# Patient Record
Sex: Female | Born: 1990 | Race: Black or African American | Hispanic: No | Marital: Single | Smoking: Former smoker
Health system: Southern US, Community
[De-identification: ages and names within clinical notes are randomized; demographics above are authoritative.]

## PROBLEM LIST (undated history)

## (undated) DIAGNOSIS — A549 Gonococcal infection, unspecified: Secondary | ICD-10-CM

## (undated) DIAGNOSIS — T7421XA Adult sexual abuse, confirmed, initial encounter: Secondary | ICD-10-CM

## (undated) DIAGNOSIS — J45909 Unspecified asthma, uncomplicated: Secondary | ICD-10-CM

## (undated) HISTORY — DX: Gonococcal infection, unspecified: A54.9

## (undated) HISTORY — DX: Adult sexual abuse, confirmed, initial encounter: T74.21XA

## (undated) HISTORY — PX: NO PAST SURGERIES: SHX2092

---
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
## 2014-08-16 ENCOUNTER — Emergency Department (HOSPITAL_COMMUNITY)
Admission: EM | Admit: 2014-08-16 | Discharge: 2014-08-16 | Disposition: A | Discharge status: Home / Self Care | Payer: BLUE CROSS/BLUE SHIELD | Attending: Emergency Medicine | Admitting: Emergency Medicine

## 2014-08-16 ENCOUNTER — Encounter (HOSPITAL_COMMUNITY): Payer: Self-pay

## 2014-08-16 DIAGNOSIS — Z3202 Encounter for pregnancy test, result negative: Secondary | ICD-10-CM | POA: Insufficient documentation

## 2014-08-16 DIAGNOSIS — Z79899 Other long term (current) drug therapy: Secondary | ICD-10-CM | POA: Diagnosis not present

## 2014-08-16 DIAGNOSIS — K2970 Gastritis, unspecified, without bleeding: Secondary | ICD-10-CM | POA: Diagnosis not present

## 2014-08-16 DIAGNOSIS — J45909 Unspecified asthma, uncomplicated: Secondary | ICD-10-CM | POA: Diagnosis not present

## 2014-08-16 DIAGNOSIS — R1084 Generalized abdominal pain: Secondary | ICD-10-CM | POA: Diagnosis present

## 2014-08-16 DIAGNOSIS — Z87891 Personal history of nicotine dependence: Secondary | ICD-10-CM | POA: Diagnosis not present

## 2014-08-16 LAB — I-STAT CHEM 8, ED
BUN: 13 mg/dL (ref 6–20)
CALCIUM ION: 1.19 mmol/L (ref 1.12–1.23)
CREATININE: 0.8 mg/dL (ref 0.44–1.00)
Chloride: 106 mmol/L (ref 101–111)
GLUCOSE: 155 mg/dL — AB (ref 65–99)
HEMATOCRIT: 42 % (ref 36.0–46.0)
Hemoglobin: 14.3 g/dL (ref 12.0–15.0)
Potassium: 3.4 mmol/L — ABNORMAL LOW (ref 3.5–5.1)
SODIUM: 144 mmol/L (ref 135–145)
TCO2: 23 mmol/L (ref 0–100)

## 2014-08-16 LAB — I-STAT BETA HCG BLOOD, ED (MC, WL, AP ONLY)

## 2014-08-16 MED ORDER — SUCRALFATE 1 GM/10ML PO SUSP: 1.0000 g | Freq: Three times a day (TID) | ORAL | Status: DC

## 2014-08-16 MED ORDER — METOCLOPRAMIDE HCL 5 MG/ML IJ SOLN
10.0000 mg | INTRAMUSCULAR | Status: AC
  Administered 2014-08-16: 10 mg via INTRAVENOUS
  Filled 2014-08-16: qty 2

## 2014-08-16 MED ORDER — PANTOPRAZOLE SODIUM 40 MG IV SOLR
40.0000 mg | INTRAVENOUS | Status: AC
  Administered 2014-08-16: 40 mg via INTRAVENOUS
  Filled 2014-08-16: qty 40

## 2014-08-16 MED ORDER — GI COCKTAIL ~~LOC~~
30.0000 mL | Freq: Once | ORAL | Status: AC
  Administered 2014-08-16: 30 mL via ORAL
  Filled 2014-08-16: qty 30

## 2014-08-16 MED ORDER — PANTOPRAZOLE SODIUM 20 MG PO TBEC: 20.0000 mg | DELAYED_RELEASE_TABLET | Freq: Every day | ORAL | Status: DC

## 2014-08-16 MED ORDER — SODIUM CHLORIDE 0.9 % IV BOLUS (SEPSIS)
1000.0000 mL | Freq: Once | INTRAVENOUS | Status: AC
Start: 2014-08-16 — End: 2014-08-16
  Administered 2014-08-16: 1000 mL via INTRAVENOUS

## 2014-08-16 NOTE — ED Notes (Signed)
PA at bedside.

## 2014-08-16 NOTE — ED Notes (Signed)
Pt c/o n/v and abdominal pain after ingesting ETOH and not eating yesterday.  Pain score 8/10.  Pt is concerned that she has ETOH poisoning.

## 2014-08-16 NOTE — ED Notes (Signed)
Gave pt ginger ale.  

## 2014-08-16 NOTE — ED Provider Notes (Signed)
CSN: [id number]     Arrival date & time 08/16/14  5621 History   First MD Initiated Contact with Patient 08/16/14 [phone]     Chief Complaint  Patient presents with  . Emesis  . Abdominal Pain    (Consider location/radiation/quality/duration/timing/severity/associated sxs/prior Treatment) HPI Comments: Patient is a 24 year old female with a history of asthma. Patient presents to the emergency department for further evaluation of nausea and vomiting with abdominal pain which began yesterday evening. Patient reports that she had nothing to eat yesterday. Yesterday evening she drank multiple alcoholic beverages including a few drinks of liquor. She reports developing some nausea later in the evening. She has had vomiting this morning which was liquidy in appearance and nonbloody. Patient is having a sharp, mild and intermittent pain in her upper abdomen with diffuse abdominal soreness. She has not taken any medication for her symptoms. She reports trying to drink fluids, but this made her feel nauseous and vomit shortly after. She denies any fever, shortness of breath, hematemesis, melanoma or hematochezia, diarrhea, urinary symptoms, or a history of abdominal surgeries. Her last bowel movement was today which was normal, per patient.  Patient is a 24 y.o. female presenting with vomiting and abdominal pain. The history is provided by the patient. No language interpreter was used.  Emesis Associated symptoms: abdominal pain   Abdominal Pain Associated symptoms: nausea and vomiting   Associated symptoms: no dysuria, no fever and no shortness of breath     Past Medical History  Diagnosis Date  . Asthma    History reviewed. No pertinent past surgical history. History reviewed. No pertinent family history. History  Substance Use Topics  . Smoking status: Former Games developer  . Smokeless tobacco: Never Used  . Alcohol Use: Yes     Comment: occasional   OB History    No data available      Review  of Systems  Constitutional: Negative for fever.  Respiratory: Negative for shortness of breath.   Gastrointestinal: Positive for nausea, vomiting and abdominal pain. Negative for blood in stool.  Genitourinary: Negative for dysuria.  All other systems reviewed and are negative.   Allergies  Review of patient's allergies indicates no known allergies.  Home Medications   Prior to Admission medications   Medication Sig Start Date End Date Taking? Authorizing Provider  ibuprofen (ADVIL,MOTRIN) 600 MG tablet Take 1 tablet (600 mg total) by mouth every 6 (six) hours as needed for pain. Patient not taking: Reported on 08/16/2014 12/16/11   Sunnie Nielsen, MD  ibuprofen (ADVIL,MOTRIN) 800 MG tablet Take 1 tablet (800 mg total) by mouth 3 (three) times daily. Patient not taking: Reported on 08/16/2014 12/15/11   Donnetta Hutching, MD  montelukast (SINGULAIR) 10 MG tablet Take 10 mg by mouth at bedtime.    Historical Provider, MD  pantoprazole (PROTONIX) 20 MG tablet Take 1 tablet (20 mg total) by mouth daily. 08/16/14   Antony Madura, PA-C  sucralfate (CARAFATE) 1 GM/10ML suspension Take 10 mLs (1 g total) by mouth 4 (four) times daily -  with meals and at bedtime. 08/16/14   Antony Madura, PA-C   BP 108/59 mmHg  Pulse 68  Temp(Src) 98.1 F (36.7 C) (Oral)  Resp 16  Ht 5\' 2"  (1.575 m)  Wt 110 lb (49.896 kg)  BMI 20.11 kg/m2  SpO2 100%  LMP 08/14/2014   Physical Exam  Constitutional: She is oriented to person, place, and time. She appears well-developed and well-nourished. No distress.  Nontoxic/nonseptic appearing. Patient in no  visible or audible discomfort.  HENT:  Head: Normocephalic and atraumatic.  Eyes: Conjunctivae and EOM are normal. No scleral icterus.  Neck: Normal range of motion.  Cardiovascular: Normal rate, regular rhythm and intact distal pulses.   Pulmonary/Chest: Effort normal and breath sounds normal. No respiratory distress. She has no wheezes. She has no rales.  Respirations even  and unlabored  Abdominal: Soft. She exhibits no distension. There is tenderness. There is no rebound and no guarding.  Mild focal tenderness to palpation in the epigastric abdomen. Negative Murphy sign. No masses or peritoneal signs. Abdomen soft.  Musculoskeletal: Normal range of motion.  Neurological: She is alert and oriented to person, place, and time. She exhibits normal muscle tone. Coordination normal.  GCS 15. Patient moving all extremities. She is ambulatory in the ED with steady gait.  Skin: Skin is warm and dry. No rash noted. She is not diaphoretic. No erythema. No pallor.  Psychiatric: She has a normal mood and affect. Her behavior is normal.  Nursing note and vitals reviewed.   ED Course  Procedures (including critical care time) Labs Review Labs Reviewed  I-STAT CHEM 8, ED - Abnormal; Notable for the following:    Potassium 3.4 (*)    Glucose, Bld 155 (*)    All other components within normal limits  I-STAT BETA HCG BLOOD, ED ([facility], [facility], [facility] ONLY)    Imaging Review No results found.   EKG Interpretation None      MDM   Final diagnoses:  Gastritis    24 year old female presents to the emergency department for evaluation of abdominal pain, nausea, and vomiting x 1 day. She reports no oral intake yesterday, other than many alcoholic beverages. Patient with some mild epigastric tenderness on exam. She has an improved abdominal reexamination after treatment with IV fluids, protonix, GI cocktail, and Reglan. No masses or peritoneal signs. Laboratory work up is noncontributory. Patient is now tolerating oral fluids without c/o pain or further emesis. Symptoms suspected to be secondary to gastritis, likely alcohol induced. Will d/c on course of Protonix and Carafate. Return precautions discussed and provided. Patient discharged in good condition with no unaddressed concerns.   Filed Vitals:   08/16/14 0828 08/16/14 1013  BP: 105/63 108/59  Pulse: 70 68  Temp: 98.1 F  (36.7 C)   TempSrc: Oral   Resp: 18 16  Height:  (1.575 m)   Weight: 110 lb (49.896 kg)   SpO2: 100% 100%     Antony Madura, PA-C 08/16/14 1128  Azalia Bilis, MD 08/16/14 [phone]

## 2014-08-16 NOTE — Discharge Instructions (Signed)

## 2014-10-23 ENCOUNTER — Telehealth (HOSPITAL_BASED_OUTPATIENT_CLINIC_OR_DEPARTMENT_OTHER): Payer: Self-pay | Admitting: Emergency Medicine

## 2015-11-02 ENCOUNTER — Other Ambulatory Visit: Payer: Self-pay | Admitting: Physician Assistant

## 2015-11-02 DIAGNOSIS — R1032 Left lower quadrant pain: Secondary | ICD-10-CM

## 2015-11-02 DIAGNOSIS — R102 Pelvic and perineal pain: Secondary | ICD-10-CM

## 2015-11-11 ENCOUNTER — Ambulatory Visit
Admission: RE | Admit: 2015-11-11 | Discharge: 2015-11-11 | Disposition: A | Discharge status: Home / Self Care | Payer: BLUE CROSS/BLUE SHIELD | Source: Ambulatory Visit | Attending: Physician Assistant | Admitting: Physician Assistant

## 2015-11-11 DIAGNOSIS — R1032 Left lower quadrant pain: Secondary | ICD-10-CM

## 2015-11-11 DIAGNOSIS — R102 Pelvic and perineal pain: Secondary | ICD-10-CM

## 2015-11-16 ENCOUNTER — Other Ambulatory Visit: Payer: BLUE CROSS/BLUE SHIELD

## 2016-02-16 ENCOUNTER — Encounter (HOSPITAL_COMMUNITY): Payer: Self-pay

## 2016-02-16 DIAGNOSIS — J45909 Unspecified asthma, uncomplicated: Secondary | ICD-10-CM | POA: Insufficient documentation

## 2016-02-16 DIAGNOSIS — Z87891 Personal history of nicotine dependence: Secondary | ICD-10-CM | POA: Insufficient documentation

## 2016-02-16 DIAGNOSIS — K5900 Constipation, unspecified: Secondary | ICD-10-CM | POA: Insufficient documentation

## 2016-02-16 DIAGNOSIS — R1084 Generalized abdominal pain: Secondary | ICD-10-CM | POA: Diagnosis present

## 2016-02-16 LAB — I-STAT CHEM 8, ED
BUN: 14 mg/dL (ref 6–20)
CHLORIDE: 104 mmol/L (ref 101–111)
CREATININE: 0.8 mg/dL (ref 0.44–1.00)
Calcium, Ion: 1.21 mmol/L (ref 1.15–1.40)
GLUCOSE: 86 mg/dL (ref 65–99)
HEMATOCRIT: 39 % (ref 36.0–46.0)
HEMOGLOBIN: 13.3 g/dL (ref 12.0–15.0)
POTASSIUM: 4.3 mmol/L (ref 3.5–5.1)
Sodium: 142 mmol/L (ref 135–145)
TCO2: 29 mmol/L (ref 0–100)

## 2016-02-16 NOTE — ED Triage Notes (Signed)
Pt complaining of RLQ abdominal pain. Pt states that she feels like something is stuck there. She states that she has been having this pain since 2013. She also states "I'm not suicidal, but sometimes I think that I just need to stab my stomach and remove it." Pt also states that she is vegan and is chronically constipated. Endorses nausea today when she was crying, but states that the vomit feels stuck. A&O. Ambulatory,

## 2016-02-17 ENCOUNTER — Emergency Department (HOSPITAL_COMMUNITY)
Admission: EM | Admit: 2016-02-17 | Discharge: 2016-02-17 | Disposition: A | Discharge status: Home / Self Care | Payer: BLUE CROSS/BLUE SHIELD | Attending: Emergency Medicine | Admitting: Emergency Medicine

## 2016-02-17 ENCOUNTER — Encounter (HOSPITAL_COMMUNITY): Payer: Self-pay | Admitting: Emergency Medicine

## 2016-02-17 DIAGNOSIS — K5900 Constipation, unspecified: Secondary | ICD-10-CM

## 2016-02-17 LAB — URINALYSIS, ROUTINE W REFLEX MICROSCOPIC
Bilirubin Urine: NEGATIVE
GLUCOSE, UA: NEGATIVE mg/dL
HGB URINE DIPSTICK: NEGATIVE
Ketones, ur: NEGATIVE mg/dL
Leukocytes, UA: NEGATIVE
Nitrite: NEGATIVE
Protein, ur: NEGATIVE mg/dL
SPECIFIC GRAVITY, URINE: 1.026 (ref 1.005–1.030)
pH: 5 (ref 5.0–8.0)

## 2016-02-17 LAB — I-STAT BETA HCG BLOOD, ED (MC, WL, AP ONLY): I-stat hCG, quantitative: <5 m[IU]/mL (ref <5)

## 2016-02-17 LAB — CBC
HCT: 37.6 % (ref 36.0–46.0)
HEMOGLOBIN: 12.9 g/dL (ref 12.0–15.0)
MCH: 29.6 pg (ref 26.0–34.0)
MCHC: 34.3 g/dL (ref 30.0–36.0)
MCV: 86.2 fL (ref 78.0–100.0)
Platelets: 213 10*3/uL (ref 150–400)
RBC: 4.36 MIL/uL (ref 3.87–5.11)
RDW: 12.1 % (ref 11.5–15.5)
WBC: 4.2 10*3/uL (ref 4.0–10.5)

## 2016-02-17 MED ORDER — ACETAMINOPHEN 500 MG PO TABS
1000.0000 mg | ORAL_TABLET | Freq: Once | ORAL | Status: AC
  Administered 2016-02-17: 1000 mg via ORAL
  Filled 2016-02-17: qty 2

## 2016-02-17 NOTE — ED Provider Notes (Signed)
[facility]-EMERGENCY DEPT Provider Note   CSN: [id number] Arrival date & time: 02/16/16  2334  By signing my name below, I, Bing Neighbors., attest that this documentation has been prepared under the direction and in the presence of Taquilla Downum, MD. Electronically signed: Bing Neighbors., ED Scribe. 02/17/16. 3:16 AM.    History   Chief Complaint Chief Complaint  Patient presents with  . Abdominal Pain    HPI  Jordan Haley is a 26 y.o. female with hx of gonorrhea, and chronic constipation who presents to the Emergency Department complaining of mild to moderate RLQ abdominal pain with onset x5 years. Pt states that she has had RLQ abdominal pain since 2013. Pt describes the pain as a pressure in the RLQ. Pt reports nausea, hx of chronic constipation. Pt denies any modifying factors but states that apple cider vinegar and pressure alleviates the pain. Pt denies vomiting. Pt is a vegan and states that she eats soy but suspects that she may be allergic to it.     The history is provided by the patient. No language interpreter was used.  Abdominal Pain   This is a chronic problem. The current episode started more than 1 week ago (5 years ago). The problem occurs constantly. The problem has not changed since onset.The pain is associated with an unknown factor. The pain is located in the generalized abdominal region. The pain is moderate. Associated symptoms include constipation. Pertinent negatives include anorexia, fever, belching, diarrhea, flatus, hematochezia, melena, nausea, vomiting, dysuria, frequency, hematuria, headaches, arthralgias and myalgias. Nothing aggravates the symptoms. Nothing relieves the symptoms. Past workup does not include GI consult. Her past medical history does not include PUD.    Past Medical History:  Diagnosis Date  . Asthma     There are no active problems to display for this patient.   History reviewed. No pertinent surgical  history.  OB History    No data available       Home Medications    Prior to Admission medications   Medication Sig Start Date End Date Taking? Authorizing Provider  ibuprofen (ADVIL,MOTRIN) 600 MG tablet Take 1 tablet (600 mg total) by mouth every 6 (six) hours as needed for pain. Patient not taking: Reported on 08/16/2014 12/16/11   Sunnie Nielsen, MD  ibuprofen (ADVIL,MOTRIN) 800 MG tablet Take 1 tablet (800 mg total) by mouth 3 (three) times daily. Patient not taking: Reported on 08/16/2014 12/15/11   Donnetta Hutching, MD  pantoprazole (PROTONIX) 20 MG tablet Take 1 tablet (20 mg total) by mouth daily. Patient not taking: Reported on 02/17/2016 08/16/14   Antony Madura, PA-C  sucralfate (CARAFATE) 1 GM/10ML suspension Take 10 mLs (1 g total) by mouth 4 (four) times daily -  with meals and at bedtime. Patient not taking: Reported on 02/17/2016 08/16/14   Antony Madura, PA-C    Family History History reviewed. No pertinent family history.  Social History Social History  Substance Use Topics  . Smoking status: Former Games developer  . Smokeless tobacco: Never Used  . Alcohol use Yes     Comment: occasional     Allergies   Patient has no known allergies.   Review of Systems Review of Systems  Constitutional: Negative for chills and fever.  Gastrointestinal: Positive for abdominal pain and constipation. Negative for anorexia, diarrhea, flatus, hematochezia, melena, nausea, rectal pain and vomiting.  Genitourinary: Negative for dysuria, frequency, hematuria and vaginal discharge.  Musculoskeletal: Negative for arthralgias and myalgias.  Neurological: Negative for  headaches.  All other systems reviewed and are negative.    Physical Exam Updated Vital Signs BP 105/70 (BP Location: Left Arm)   Pulse 79   Temp 97.7 F (36.5 C) (Oral)   Resp 18   SpO2 95%   Physical Exam  Constitutional: She appears well-developed and well-nourished. No distress.  HENT:  Head: Normocephalic and  atraumatic.  Mouth/Throat: Mucous membranes are normal. No oropharyngeal exudate.  Moist mucous membranes.  Eyes: Conjunctivae are normal.  Neck: Normal range of motion. Neck supple.  Cardiovascular: Normal rate, regular rhythm and intact distal pulses.   No murmur heard. Pulmonary/Chest: Effort normal and breath sounds normal. No respiratory distress. She has no wheezes. She has no rales.  Abdominal: Soft. She exhibits no mass. Bowel sounds are increased. There is no hepatosplenomegaly, splenomegaly or hepatomegaly. There is no tenderness. There is no rebound and no guarding. No hernia.  Diffuse constipation through the ascending and descending colon, no masses. Hyperactive bowel sounds.   Musculoskeletal: Normal range of motion. She exhibits no edema or tenderness.  Neurological: She is alert.  Skin: Skin is warm and dry. Capillary refill takes less than 2 seconds.  Psychiatric: She has a normal mood and affect.  Nursing note and vitals reviewed.    ED Treatments / Results   Vitals:   02/16/16 2342 02/17/16 0324  BP: 105/70 (!) 86/53  Pulse: 79 69  Resp: 18 16  Temp: 97.7 F (36.5 C)    Results for orders placed or performed during the hospital encounter of 02/17/16  CBC  Result Value Ref Range   WBC 4.2 4.0 - 10.5 K/uL   RBC 4.36 3.87 - 5.11 MIL/uL   Hemoglobin 12.9 12.0 - 15.0 g/dL   HCT 16.1 09.6 - 04.5 %   MCV 86.2 78.0 - 100.0 fL   MCH 29.6 26.0 - 34.0 pg   MCHC 34.3 30.0 - 36.0 g/dL   RDW 40.9 81.1 - 91.4 %   Platelets 213 150 - 400 K/uL  Urinalysis, Routine w reflex microscopic  Result Value Ref Range   Color, Urine YELLOW YELLOW   APPearance CLEAR CLEAR   Specific Gravity, Urine 1.026 1.005 - 1.030   pH 5.0 5.0 - 8.0   Glucose, UA NEGATIVE NEGATIVE mg/dL   Hgb urine dipstick NEGATIVE NEGATIVE   Bilirubin Urine NEGATIVE NEGATIVE   Ketones, ur NEGATIVE NEGATIVE mg/dL   Protein, ur NEGATIVE NEGATIVE mg/dL   Nitrite NEGATIVE NEGATIVE   Leukocytes, UA NEGATIVE  NEGATIVE  I-Stat beta hCG blood, ED  Result Value Ref Range   I-stat hCG, quantitative <5.0 <5 mIU/mL   Comment 3          I-Stat Chem 8, ED  Result Value Ref Range   Sodium 142 135 - 145 mmol/L   Potassium 4.3 3.5 - 5.1 mmol/L   Chloride 104 101 - 111 mmol/L   BUN 14 6 - 20 mg/dL   Creatinine, Ser 7.82 0.44 - 1.00 mg/dL   Glucose, Bld 86 65 - 99 mg/dL   Calcium, Ion 9.56 2.13 - 1.40 mmol/L   TCO2 29 0 - 100 mmol/L   Hemoglobin 13.3 12.0 - 15.0 g/dL   HCT 08.6 57.8 - 46.9 %   No results found.   COORDINATION OF CARE: 3:17 AM-Discussed next steps with pt. Pt verbalized understanding and is agreeable with the plan.   Radiology No results found.  Procedures Procedures (including critical care time)  Medications Ordered in ED Medications  acetaminophen (TYLENOL) tablet  1,000 mg (1,000 mg Oral Given 02/17/16 0430)     Final Clinical Impressions(s) / ED Diagnoses  Exam and history are consistent with chronic constipation.  All questions answered to patient's satisfaction. Based on history and exam patient has been appropriately medically screened and emergency conditions excluded. Patient is stable for discharge at this time. Strict return precautions given for any further episodes, persistent fever, weakness or any concerns. Prune juice for constipation as patient only wants to try "all natural things"   New Prescriptions New Prescriptions   No medications on file   I personally performed the services described in this documentation, which was scribed in my presence. The recorded information has been reviewed and is accurate.       Cy BlamerApril Sherrey North, MD 02/17/16 [phone]

## 2016-02-17 NOTE — Discharge Instructions (Signed)
Prune juice twice a day

## 2016-04-17 ENCOUNTER — Emergency Department (HOSPITAL_COMMUNITY)
Admission: EM | Admit: 2016-04-17 | Discharge: 2016-04-18 | Disposition: A | Discharge status: Other Acute Inpatient Hospital | Payer: BLUE CROSS/BLUE SHIELD | Attending: Emergency Medicine | Admitting: Emergency Medicine

## 2016-04-17 DIAGNOSIS — Z79899 Other long term (current) drug therapy: Secondary | ICD-10-CM | POA: Insufficient documentation

## 2016-04-17 DIAGNOSIS — R440 Auditory hallucinations: Secondary | ICD-10-CM | POA: Diagnosis present

## 2016-04-17 DIAGNOSIS — F333 Major depressive disorder, recurrent, severe with psychotic symptoms: Secondary | ICD-10-CM | POA: Insufficient documentation

## 2016-04-17 DIAGNOSIS — R1033 Periumbilical pain: Secondary | ICD-10-CM | POA: Diagnosis not present

## 2016-04-17 DIAGNOSIS — Z87891 Personal history of nicotine dependence: Secondary | ICD-10-CM | POA: Diagnosis not present

## 2016-04-17 DIAGNOSIS — J45909 Unspecified asthma, uncomplicated: Secondary | ICD-10-CM | POA: Diagnosis not present

## 2016-04-17 LAB — CBC
HCT: 40.3 % (ref 36.0–46.0)
Hemoglobin: 13.5 g/dL (ref 12.0–15.0)
MCH: 29.2 pg (ref 26.0–34.0)
MCHC: 33.5 g/dL (ref 30.0–36.0)
MCV: 87 fL (ref 78.0–100.0)
Platelets: 258 10*3/uL (ref 150–400)
RBC: 4.63 MIL/uL (ref 3.87–5.11)
RDW: 12.2 % (ref 11.5–15.5)
WBC: 3.2 10*3/uL — ABNORMAL LOW (ref 4.0–10.5)

## 2016-04-17 LAB — COMPREHENSIVE METABOLIC PANEL
ALK PHOS: 55 U/L (ref 38–126)
ALT: 12 U/L — ABNORMAL LOW (ref 14–54)
ANION GAP: 3 — AB (ref 5–15)
AST: 17 U/L (ref 15–41)
Albumin: 4.7 g/dL (ref 3.5–5.0)
BILIRUBIN TOTAL: 0.9 mg/dL (ref 0.3–1.2)
BUN: 6 mg/dL (ref 6–20)
CALCIUM: 9.1 mg/dL (ref 8.9–10.3)
CO2: 27 mmol/L (ref 22–32)
Chloride: 110 mmol/L (ref 101–111)
Creatinine, Ser: 0.7 mg/dL (ref 0.44–1.00)
GFR calc non Af Amer: >60 mL/min (ref >60)
Glucose, Bld: 95 mg/dL (ref 65–99)
Potassium: 3.7 mmol/L (ref 3.5–5.1)
Sodium: 140 mmol/L (ref 135–145)
TOTAL PROTEIN: 7.2 g/dL (ref 6.5–8.1)

## 2016-04-17 LAB — RAPID URINE DRUG SCREEN, HOSP PERFORMED
Amphetamines: NOT DETECTED
BARBITURATES: NOT DETECTED
Benzodiazepines: NOT DETECTED
COCAINE: NOT DETECTED
Opiates: NOT DETECTED
Tetrahydrocannabinol: NOT DETECTED

## 2016-04-17 LAB — ETHANOL

## 2016-04-17 LAB — SALICYLATE LEVEL

## 2016-04-17 LAB — ACETAMINOPHEN LEVEL
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
## 2016-04-17 NOTE — ED Notes (Signed)
Pt presents with SI, denies at present, stating she was that way earlier.  Feeling hopeless and depressed.  Admits to hearing voices.  Denies drug or alcohol use.  Monitoring for safety, Q 15 min checks in effect.  Safety check for contraband completed, no items found.
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
## 2016-04-17 NOTE — ED Notes (Signed)
Report called to RN Erskine SquibbJane, [facility], rm [id number].  Pending [facility] transport.

## 2016-04-18 ENCOUNTER — Encounter (HOSPITAL_COMMUNITY): Payer: Self-pay | Admitting: *Deleted

## 2016-04-18 ENCOUNTER — Inpatient Hospital Stay (HOSPITAL_COMMUNITY)
Admission: AD | Admit: 2016-04-18 | Discharge: 2016-04-23 | DRG: 897 | Disposition: A | Discharge status: Home / Self Care | Payer: BLUE CROSS/BLUE SHIELD | Source: Intra-hospital | Attending: Psychiatry | Admitting: Psychiatry

## 2016-04-18 DIAGNOSIS — K219 Gastro-esophageal reflux disease without esophagitis: Secondary | ICD-10-CM | POA: Diagnosis present

## 2016-04-18 DIAGNOSIS — F1220 Cannabis dependence, uncomplicated: Secondary | ICD-10-CM | POA: Clinically undetermined

## 2016-04-18 DIAGNOSIS — R45851 Suicidal ideations: Secondary | ICD-10-CM | POA: Diagnosis present

## 2016-04-18 DIAGNOSIS — F19950 Other psychoactive substance use, unspecified with psychoactive substance-induced psychotic disorder with delusions: Secondary | ICD-10-CM | POA: Diagnosis present

## 2016-04-18 DIAGNOSIS — G8929 Other chronic pain: Secondary | ICD-10-CM | POA: Diagnosis present

## 2016-04-18 DIAGNOSIS — Z79899 Other long term (current) drug therapy: Secondary | ICD-10-CM | POA: Diagnosis not present

## 2016-04-18 DIAGNOSIS — F4323 Adjustment disorder with mixed anxiety and depressed mood: Secondary | ICD-10-CM | POA: Clinically undetermined

## 2016-04-18 DIAGNOSIS — J45909 Unspecified asthma, uncomplicated: Secondary | ICD-10-CM | POA: Diagnosis present

## 2016-04-18 DIAGNOSIS — F250 Schizoaffective disorder, bipolar type: Secondary | ICD-10-CM | POA: Diagnosis not present

## 2016-04-18 DIAGNOSIS — F3181 Bipolar II disorder: Secondary | ICD-10-CM | POA: Clinically undetermined

## 2016-04-18 DIAGNOSIS — Z7289 Other problems related to lifestyle: Secondary | ICD-10-CM | POA: Diagnosis not present

## 2016-04-18 DIAGNOSIS — F323 Major depressive disorder, single episode, severe with psychotic features: Secondary | ICD-10-CM | POA: Diagnosis present

## 2016-04-18 DIAGNOSIS — F411 Generalized anxiety disorder: Secondary | ICD-10-CM | POA: Diagnosis present

## 2016-04-18 DIAGNOSIS — R109 Unspecified abdominal pain: Secondary | ICD-10-CM | POA: Diagnosis present

## 2016-04-18 DIAGNOSIS — Z87891 Personal history of nicotine dependence: Secondary | ICD-10-CM

## 2016-04-18 DIAGNOSIS — F1290 Cannabis use, unspecified, uncomplicated: Secondary | ICD-10-CM | POA: Diagnosis not present

## 2016-04-18 LAB — TSH: TSH: 1.331 u[IU]/mL (ref 0.350–4.500)

## 2016-04-18 LAB — LIPID PANEL
CHOL/HDL RATIO: 2.3 ratio
Cholesterol: 104 mg/dL (ref 0–200)
HDL: 45 mg/dL (ref >40)
LDL Cholesterol: 48 mg/dL (ref 0–99)
Triglycerides: 56 mg/dL (ref <150)
VLDL: 11 mg/dL (ref 0–40)

## 2016-04-18 LAB — PREGNANCY, URINE: PREG TEST UR: NEGATIVE

## 2016-04-18 MED ORDER — ACETAMINOPHEN 325 MG PO TABS: 650.0000 mg | ORAL_TABLET | Freq: Four times a day (QID) | ORAL | Status: DC | PRN

## 2016-04-18 MED ORDER — HALOPERIDOL 5 MG PO TABS: 5.0000 mg | ORAL_TABLET | Freq: Four times a day (QID) | ORAL | Status: DC | PRN

## 2016-04-18 MED ORDER — ALUM & MAG HYDROXIDE-SIMETH 200-200-20 MG/5ML PO SUSP: 30.0000 mL | ORAL | Status: DC | PRN

## 2016-04-18 MED ORDER — OLANZAPINE 2.5 MG PO TABS
2.5000 mg | ORAL_TABLET | Freq: Every day | ORAL | Status: DC
  Filled 2016-04-18 (×3): qty 1

## 2016-04-18 MED ORDER — RISPERIDONE 2 MG PO TBDP: 2.0000 mg | ORAL_TABLET | Freq: Three times a day (TID) | ORAL | Status: DC | PRN

## 2016-04-18 MED ORDER — PANTOPRAZOLE SODIUM 20 MG PO TBEC
20.0000 mg | DELAYED_RELEASE_TABLET | Freq: Every day | ORAL | Status: DC
  Administered 2016-04-18 – 2016-04-23 (×4): 20 mg via ORAL
  Filled 2016-04-18 (×9): qty 1

## 2016-04-18 MED ORDER — SUCRALFATE 1 GM/10ML PO SUSP
1.0000 g | Freq: Three times a day (TID) | ORAL | Status: DC
  Administered 2016-04-18 – 2016-04-23 (×9): 1 g via ORAL
  Filled 2016-04-18 (×31): qty 10

## 2016-04-18 MED ORDER — HALOPERIDOL LACTATE 5 MG/ML IJ SOLN: 5.0000 mg | Freq: Four times a day (QID) | INTRAMUSCULAR | Status: DC | PRN

## 2016-04-18 MED ORDER — ARIPIPRAZOLE 10 MG PO TABS
10.0000 mg | ORAL_TABLET | Freq: Every day | ORAL | Status: DC
  Administered 2016-04-19 – 2016-04-20 (×2): 10 mg via ORAL
  Filled 2016-04-18 (×4): qty 1

## 2016-04-18 MED ORDER — FLUOXETINE HCL 20 MG PO CAPS
20.0000 mg | ORAL_CAPSULE | Freq: Every day | ORAL | Status: DC
  Administered 2016-04-18: 20 mg via ORAL
  Filled 2016-04-18 (×4): qty 1

## 2016-04-18 MED ORDER — TRAZODONE HCL 50 MG PO TABS
50.0000 mg | ORAL_TABLET | Freq: Every evening | ORAL | Status: DC | PRN
  Filled 2016-04-18 (×14): qty 1

## 2016-04-18 MED ORDER — IBUPROFEN 600 MG PO TABS: 600.0000 mg | ORAL_TABLET | Freq: Four times a day (QID) | ORAL | Status: DC | PRN

## 2016-04-18 MED ORDER — ZIPRASIDONE MESYLATE 20 MG IM SOLR: 20.0000 mg | INTRAMUSCULAR | Status: DC | PRN

## 2016-04-18 MED ORDER — LORAZEPAM 1 MG PO TABS: 1.0000 mg | ORAL_TABLET | ORAL | Status: DC | PRN

## 2016-04-18 MED ORDER — HYDROXYZINE HCL 25 MG PO TABS: 25.0000 mg | ORAL_TABLET | Freq: Three times a day (TID) | ORAL | Status: DC | PRN

## 2016-04-18 MED ORDER — MAGNESIUM HYDROXIDE 400 MG/5ML PO SUSP: 30.0000 mL | Freq: Every day | ORAL | Status: DC | PRN

## 2016-04-18 NOTE — Tx Team (Signed)
Interdisciplinary Treatment and Diagnostic Plan Update  04/19/2016 Time of Session: 8:36 AM  Jordan Haley MRN: [id number]  Principal Diagnosis: Schizoaffective disorder, bipolar type ([place])  Secondary Diagnoses: Principal Problem:   Schizoaffective disorder, bipolar type ([place]) Active Problems:   Severe major depression with psychotic features ([place])   Current Medications:  Current Facility-Administered Medications  Medication Dose Route Frequency Provider Last Rate Last Dose  . acetaminophen (TYLENOL) tablet 650 mg  650 mg Oral Q6H PRN Laverle Hobby, PA-C      . alum & mag hydroxide-simeth (MAALOX/MYLANTA) 200-200-20 MG/5ML suspension 30 mL  30 mL Oral Q4H PRN Laverle Hobby, PA-C      . ARIPiprazole (ABILIFY) tablet 10 mg  10 mg Oral Daily Artist Beach, MD   10 mg at 04/19/16 0819  . haloperidol (HALDOL) tablet 5 mg  5 mg Oral Q6H PRN Artist Beach, MD       Or  . haloperidol lactate (HALDOL) injection 5 mg  5 mg Intramuscular Q6H PRN Artist Beach, MD      . hydrOXYzine (ATARAX/VISTARIL) tablet 25 mg  25 mg Oral TID PRN Laverle Hobby, PA-C      . ibuprofen (ADVIL,MOTRIN) tablet 600 mg  600 mg Oral Q6H PRN Laverle Hobby, PA-C      . LORazepam (ATIVAN) tablet 1 mg  1 mg Oral PRN Laverle Hobby, PA-C      . magnesium hydroxide (MILK OF MAGNESIA) suspension 30 mL  30 mL Oral Daily PRN Laverle Hobby, PA-C      . pantoprazole (PROTONIX) EC tablet 20 mg  20 mg Oral Daily Laverle Hobby, PA-C   20 mg at 04/19/16 0819  . sucralfate (CARAFATE) 1 GM/10ML suspension 1 g  1 g Oral TID WC & HS Laverle Hobby, PA-C   1 g at 04/19/16 0819  . traZODone (DESYREL) tablet 50 mg  50 mg Oral QHS,MR X 1 Spencer E Simon, PA-C        PTA Medications: Prescriptions Prior to Admission  Medication Sig Dispense Refill Last Dose  . ibuprofen (ADVIL,MOTRIN) 600 MG tablet Take 1 tablet (600 mg total) by mouth every 6 (six) hours as needed for pain. (Patient not taking: Reported on  08/16/2014) 30 tablet 0 Unknown at Unknown time  . ibuprofen (ADVIL,MOTRIN) 800 MG tablet Take 1 tablet (800 mg total) by mouth 3 (three) times daily. (Patient not taking: Reported on 08/16/2014) 21 tablet 0 Unknown at Unknown time  . pantoprazole (PROTONIX) 20 MG tablet Take 1 tablet (20 mg total) by mouth daily. (Patient not taking: Reported on 02/17/2016) 30 tablet 0 Unknown at Unknown time  . sucralfate (CARAFATE) 1 GM/10ML suspension Take 10 mLs (1 g total) by mouth 4 (four) times daily -  with meals and at bedtime. (Patient not taking: Reported on 02/17/2016) 420 mL 0 Unknown at Unknown time    Treatment Modalities: Medication Management, Group therapy, Case management,  1 to 1 session with clinician, Psychoeducation, Recreational therapy.   Physician Treatment Plan for Primary Diagnosis: Schizoaffective disorder, bipolar type ([place]) Long Term Goal(s): Improvement in symptoms so as ready for discharge  Short Term Goals: Ability to identify changes in lifestyle to reduce recurrence of condition will improve and Ability to disclose and discuss suicidal ideas  Medication Management: Evaluate patient's response, side effects, and tolerance of medication regimen.  Therapeutic Interventions: 1 to 1 sessions, Unit Group sessions and Medication administration.  Evaluation of Outcomes: Not Met  Physician  Treatment Plan for Secondary Diagnosis: Principal Problem:   Schizoaffective disorder, bipolar type ([place]) Active Problems:   Severe major depression with psychotic features ([place])   Long Term Goal(s): Improvement in symptoms so as ready for discharge  Short Term Goals: Compliance with prescribed medications will improve  Medication Management: Evaluate patient's response, side effects, and tolerance of medication regimen.  Therapeutic Interventions: 1 to 1 sessions, Unit Group sessions and Medication administration.  Evaluation of Outcomes: Not Met   RN Treatment Plan for Primary  Diagnosis: Schizoaffective disorder, bipolar type ([place]) Long Term Goal(s): Knowledge of disease and therapeutic regimen to maintain health will improve  Short Term Goals: Ability to disclose and discuss suicidal ideas and Compliance with prescribed medications will improve  Medication Management: RN will administer medications as ordered by provider, will assess and evaluate patient's response and provide education to patient for prescribed medication. RN will report any adverse and/or side effects to prescribing provider.  Therapeutic Interventions: 1 on 1 counseling sessions, Psychoeducation, Medication administration, Evaluate responses to treatment, Monitor vital signs and CBGs as ordered, Perform/monitor CIWA, COWS, AIMS and Fall Risk screenings as ordered, Perform wound care treatments as ordered.  Evaluation of Outcomes: Not Met   LCSW Treatment Plan for Primary Diagnosis: Schizoaffective disorder, bipolar type ([place]) Long Term Goal(s): Safe transition to appropriate next level of care at discharge, Engage patient in therapeutic group addressing interpersonal concerns.  Short Term Goals: Engage patient in aftercare planning with referrals and resources and Increase skills for wellness and recovery  Therapeutic Interventions: Assess for all discharge needs, 1 to 1 time with Social worker, Explore available resources and support systems, Assess for adequacy in community support network, Educate family and significant other(s) on suicide prevention, Complete Psychosocial Assessment, Interpersonal group therapy.  Evaluation of Outcomes: Not Met   Progress in Treatment: Attending groups: Yes Participating in groups: Yes Taking medication as prescribed: Yes, MD continues to assess for medication changes as needed Toleration medication: Yes, no side effects reported at this time Family/Significant other contact made: No, there are no supports who are locally available at this time.   Patient understands diagnosis: Limited insight  Discussing patient identified problems/goals with staff: Yes Medical problems stabilized or resolved: Yes Denies suicidal/homicidal ideation: No  Issues/concerns per patient self-inventory: None Other: N/A  New problem(s) identified: None identified at this time.   New Short Term/Long Term Goal(s): None identified at this time.   Discharge Plan or Barriers: Return home and follow up with [facility]   Reason for Continuation of Hospitalization: Anxiety Depression Hallucinations Medication stabilization Suicidal ideation   Estimated Length of Stay: 3-5 days  Attendees: Patient: 04/19/2016  8:36 AM  Physician: Dr. Shea Evans 04/19/2016  8:36 AM  Nursing:  Opal Sidles. Jenetta Downer RN  04/19/2016  8:36 AM  RN Care Manager: Lars Pinks 04/19/2016  8:36 AM  Social Worker: Ripley Fraise, LCSW 04/19/2016  8:36 AM  Recreational Therapist: Winfield Cunas 04/19/2016  8:36 AM  Other: Radonna Ricker, Social Work Intern  04/19/2016  8:36 AM  Other:  04/19/2016  8:36 AM  Other: 04/19/2016  8:36 AM    Scribe for Treatment Team: Radonna Ricker, Social Work Intern 04/19/2016 8:36 AM

## 2016-04-18 NOTE — Progress Notes (Signed)
Recreation Therapy Notes  INPATIENT RECREATION THERAPY ASSESSMENT  Patient Details Name: Ferd GlassingMariama Dolph MRN: [id number] DOB: 21-Mar-1990 Today's Date: 04/18/2016  Patient Stressors: Other (Comment) (Interacting with people; looking for own reassurance)  Pt stated she was here because she felt hopeless, lonely and suicidal.  Coping Skills:   Isolate, Avoidance, Exercise, Art/Dance, Music  Personal Challenges: Anger, Communication, Concentration, Decision-Making, Expressing Yourself, Problem-Solving, Relationships, School Performance, Self-Esteem/Confidence, Social Interaction, Stress Management, Time Management, Trusting Others  Leisure Interests (2+):  Individual - Reading, Social - Friends, Music - Listen, Sports - Dance, Individual - Other (Comment) (Clean up; think of new ideas; meditate)  Awareness of Community Resources:  No  Patient Strengths:  Connecting with people; being myself  Patient Identified Areas of Improvement:  Being honest with myself; being proud of self  Current Recreation Participation:  Not often/ weekends  Patient Goal for Hospitalization:  "To completely feel good about what's going on in my life, be happy"  [place]idence[place]  [place]idence[place]  Current [place] (including self-harm):  No  Current HI:  No  Consent to Intern Participation: N/A   Caroll RancherMarjette Shanequa Whitenight, LRT/CTRS  Caroll RancherLindsay, Brena Windsor A 04/18/2016, 2:35 PM

## 2016-04-18 NOTE — Progress Notes (Signed)
[facility] Group Notes:  (Nursing/MHT/Case Management/Adjunct)  Date:  04/18/2016  Time:  9:16 PM  Type of Therapy:  Psychoeducational Skills  Participation Level:  Active  Participation Quality:  Appropriate  Affect:  Appropriate  Cognitive:  Appropriate  Insight:  Good  Engagement in Group:  Developing/Improving  Modes of Intervention:  Education  Summary of Progress/Problems: The patient arrived late for group but answered appropriately. She explained that she spent quite a bit of time reading and reflecting on herself and her own issues. As for her goal for tomorrow, she intends to talk more.   Hazle CocaGOODMAN, Zakiah Beckerman S 04/18/2016, 9:16 PM

## 2016-04-18 NOTE — [facility] Suicide Risk Assessment (Signed)
[facility] INPATIENT:  Family/Significant Other Suicide Prevention Education  Suicide Prevention Education:  Patient Refusal for Family/Significant Other Suicide Prevention Education: The patient Jordan Haley has refused to provide written consent for family/significant other to be provided Family/Significant Other Suicide Prevention Education during admission and/or prior to discharge.  Physician notified.   Patient did give permission to contact her father Jordan Haley, however he is currently in [place] and not available for local support.   Jordan DaubJolan Terrilyn Haley 04/18/2016, 12:47 PM

## 2016-04-18 NOTE — Progress Notes (Signed)
Recreation Therapy Notes  Date: 04/18/16 Time: 1000 Location: 500 Hall Dayroom  Group Topic: Coping Skills  Goal Area(s) Addresses:  Patients will be able to identify the benefits of coping skills. Patients will be able to positive coping skills. Patients will be able to identify benefits of using coping skills post d/c.  Intervention:  Mindmap, pencils  Activity:  Mindmap.  Patients were given a blank mindmap to fill out.  LRT and patients filled in the first eight boxes together with situations that garnered the use of coping skills.  Patients identified stress, weight gain, anxiety, depression, death in the family, hurting someone you love, losing you job and arguments.  Patients were to then come up with coping skills for each of the areas identified.  Education: PharmacologistCoping Skills, Building control surveyorDischarge Planning.   Education Outcome: Acknowledges understanding/In group clarification offered/Needs additional education.   Clinical Observations/Feedback:  Pt did not attend group.   Caroll RancherMarjette Spence Soberano, LRT/CTRS         Caroll RancherLindsay, Cybele Maule A 04/18/2016 12:49 PM
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
## 2016-04-18 NOTE — [facility] Group Notes (Signed)
[facility] Group Notes:  (Counselor/Nursing/MHT/Case Management/Adjunct)  04/18/2016 1:15PM  Type of Therapy:  Group Therapy  Participation Level:  Active  Participation Quality:  Appropriate  Affect:  Flat  Cognitive:  Oriented  Insight:  Improving  Engagement in Group:  Limited  Engagement in Therapy:  Limited  Modes of Intervention:  Discussion, Exploration and Socialization  Summary of Progress/Problems: The topic for group was balance in life.  Pt participated in the discussion about when their life was in balance and out of balance and how this feels.  Pt discussed ways to get back in balance and short term goals they can work on to get where they want to be. Invited.  Chose to not attend.  Daryel Geraldorth, Tijuan Dantes B 04/18/2016 1:40 PM

## 2016-04-18 NOTE — Progress Notes (Signed)
DAR NOTE: Pt present with flat affect and depressed mood in the unit. Pt has been isolating, stayed in the room most of time writing and reading. Pt denies physical pain, took all her meds as scheduled. As per self inventory, pt had a good night sleep, good appetite, normal energy, and good concentration. Pt rate depression at 4, hopeless ness at 2, and anxiety at 2. Pt's safety ensured with 15 minute and environmental checks. Pt currently denies SI/HI and A/V hallucinations. Pt verbally agrees to seek staff if SI/HI or A/VH occurs and to consult with staff before acting on these thoughts. Will continue POC.
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
## 2016-04-18 NOTE — Progress Notes (Signed)
EKG done, a copy placed in the chart, NP informed about the result.
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
## 2016-04-18 NOTE — H&P (Signed)
Psychiatric Admission Assessment Adult  Patient Identification: Jordan Haley MRN:  [id number] Date of Evaluation:  04/18/2016 Chief Complaint:  MDD WITH PSYCHOTIC FEATURES Principal Diagnosis: Severe major depression with psychotic features (HCC) Diagnosis:   Patient Active Problem List   Diagnosis Date Noted  . Severe major depression with psychotic features [facility]) [F32.3] 04/18/2016    Priority: High   History of Present Illness: Jordan Haley is an 26 y.o. female. She presents to [facility], voluntarily. She was [facility] to [facility] by [facility]. Patient was also accompanied by a Field seismologist. She has suicidal thoughts with a plan to stab self in the chest.   She was seen today and she was pleasant however tangential and spoke with pressured speech.  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  She states that she needs to "focus on myself holistically, I drink many teas, I go yoga, it helps me forgive myself because I have a lot of guilt.  I used to do yoga, but something is wrong with my stomach.   She reports that she was with a friend and then later a friend who was [place] who wanted to be more than just friends.  Then patient would start talking about taking holistic studies.  She sasks this NP for a list of schools that could help her study to be a provider for holistic healing.  ngoing suicidal thoughts worsening after a breakup with a boyfriend.   In the chart and the patient did not mention this"  Sts that she recently found out her boyfriend was dating a new girl. She saw pictures and videor of the exboyfriend with his new girlfriend. Sts that this made her increasingly upset and now she has distorted thoughts.  Sts that she acquired a STD and a stuck tampon yrs ago and has since suffered from chronic pain issues in her stomach. She is in constant agonizing stomach pain from those two incidents. Patient sts that she feels helpless and isolates  herself from others. She denies HI. She is currently calm and cooperative. No legal issues. She attends school at [employer] and works at [employer]. She also reports auditory hallucinations of voices telling her demeaning things about herself. The voices tell her, "You are ugly and worthless". Patient has pressured speech and flight of ideas. She has no current outpatient therapist or psychiatrist. NO history of INPT mental health history. No history of alcohol or drug use. UDS and BAL are both negative.   Associated Signs/Symptoms: Depression Symptoms:  depressed mood, fatigue, hopelessness, impaired memory, (Hypo) Manic Symptoms:  Distractibility, Flight of Ideas, Labiality of Mood, Anxiety Symptoms:  Excessive Worry, Psychotic Symptoms:  Hallucinations: Auditory PTSD Symptoms: NA Total Time spent with patient: 45 minutes  Past Psychiatric History: see HPI  Is the patient at risk to self? Yes.    Has the patient been a risk to self in the past 6 months? Yes.    Has the patient been a risk to self within the distant past? Yes.    Is the patient a risk to others? No.  Has the patient been a risk to others in the past 6 months? No.  Has the patient been a risk to others within the distant past? No.   Prior Inpatient Therapy:   Prior Outpatient Therapy:    Alcohol Screening: 1. How often do you have a drink containing alcohol?: Monthly or less 2. How many drinks containing alcohol do  you have on a typical day when you are drinking?: 1 or 2 3. How often do you have six or more drinks on one occasion?: Never Preliminary Score: 0 9. Have you or someone else been injured as a result of your drinking?: No 10. Has a relative or friend or a doctor or another health worker been concerned about your drinking or suggested you cut down?: No Alcohol Use Disorder Identification Test Final Score (AUDIT): 1 Brief Intervention: AUDIT score less than 7 or less-screening does not suggest unhealthy  drinking-brief intervention not indicated Substance Abuse History in the last 12 months:  No. Consequences of Substance Abuse: NA Previous Psychotropic Medications: No  Psychological Evaluations: Yes  Past Medical History:  Past Medical History:  Diagnosis Date  . Asthma     Past Surgical History:  Procedure Laterality Date  . NO PAST SURGERIES     Family History: History reviewed. No pertinent family history. Family Psychiatric  History: Denies mental history in family Tobacco Screening: Have you used any form of tobacco in the last 30 days? (Cigarettes, Smokeless Tobacco, Cigars, and/or Pipes): No Social History:  History  Alcohol Use  . Yes    Comment: occasional; social     History  Drug Use  . Types: Marijuana    Additional Social History: Marital status: Single Are you sexually active?: Yes What is your sexual orientation?: Heterosexual  Has your sexual activity been affected by drugs, alcohol, medication, or emotional stress?: No  Does patient have children?: No    Pain Medications: See home med list Prescriptions: See home med list Over the Counter: See home med list History of alcohol / drug use?: Yes Negative Consequences of Use: Legal Name of Substance 1: marijuana 1 - Age of First Use: teens 1 - Amount (size/oz): varies 1 - Frequency: occasionally 1 - Duration: ongoing 1 - Last Use / Amount: unknown                  Allergies:  No Known Allergies Lab Results:  Results for orders placed or performed during the hospital encounter of 04/18/16 (from the past 48 hour(s))  Pregnancy, urine     Status: None   Collection Time: 04/18/16  6:00 AM  Result Value Ref Range   Preg Test, Ur NEGATIVE NEGATIVE    Comment:        THE SENSITIVITY OF THIS METHODOLOGY IS >20 mIU/mL. Performed at [facility], [address], [place], [place] [address]   TSH     Status: None   Collection Time: 04/18/16  6:14 AM  Result Value Ref Range    TSH 1.331 0.350 - 4.500 uIU/mL    Comment: Performed by a 3rd Generation assay with a functional sensitivity of <=0.01 uIU/mL. Performed at [facility], [address], [place], [place] [address]   Lipid panel     Status: None   Collection Time: 04/18/16  6:14 AM  Result Value Ref Range   Cholesterol 104 0 - 200 mg/dL   Triglycerides 56 <098 mg/dL   HDL 45 >11 mg/dL   Total CHOL/HDL Ratio 2.3 RATIO   VLDL 11 0 - 40 mg/dL   LDL Cholesterol 48 0 - 99 mg/dL    Comment:        Total Cholesterol/HDL:CHD Risk Coronary Heart Disease Risk Table                     Men   Women  1/2 Average Risk   3.4   3.3  Average Risk       5.0   4.4  2 X Average Risk   9.6   7.1  3 X Average Risk  23.4   11.0        Use the calculated Patient Ratio above and the CHD Risk Table to determine the patient's CHD Risk.        ATP III CLASSIFICATION (LDL):  <100     mg/dL   Optimal  161-096  mg/dL   Near or Above                    Optimal  130-159  mg/dL   Borderline  045-409  mg/dL   High  >811     mg/dL   Very High Performed at [facility], [address] [place], [place] [address]     Blood Alcohol level:  Lab Results  Component Value Date   ETH <5 04/17/2016    Metabolic Disorder Labs:  No results found for: HGBA1C, MPG No results found for: PROLACTIN Lab Results  Component Value Date   CHOL 104 04/18/2016   TRIG 56 04/18/2016   HDL 45 04/18/2016   CHOLHDL 2.3 04/18/2016   VLDL 11 04/18/2016   LDLCALC 48 04/18/2016    Current Medications: Current Facility-Administered Medications  Medication Dose Route Frequency Provider Last Rate Last Dose  . acetaminophen (TYLENOL) tablet 650 mg  650 mg Oral Q6H PRN Kerry Hough, PA-C      . alum & mag hydroxide-simeth (MAALOX/MYLANTA) 200-200-20 MG/5ML suspension 30 mL  30 mL Oral Q4H PRN Kerry Hough, PA-C      . FLUoxetine (PROZAC) capsule 20 mg  20 mg Oral Daily Kerry Hough, PA-C   20 mg at 04/18/16 0830   . hydrOXYzine (ATARAX/VISTARIL) tablet 25 mg  25 mg Oral TID PRN Kerry Hough, PA-C      . ibuprofen (ADVIL,MOTRIN) tablet 600 mg  600 mg Oral Q6H PRN Kerry Hough, PA-C      . risperiDONE (RISPERDAL M-TABS) disintegrating tablet 2 mg  2 mg Oral Q8H PRN Kerry Hough, PA-C       And  . LORazepam (ATIVAN) tablet 1 mg  1 mg Oral PRN Kerry Hough, PA-C       And  . ziprasidone (GEODON) injection 20 mg  20 mg Intramuscular PRN Kerry Hough, PA-C      . magnesium hydroxide (MILK OF MAGNESIA) suspension 30 mL  30 mL Oral Daily PRN Kerry Hough, PA-C      . OLANZapine (ZYPREXA) tablet 2.5 mg  2.5 mg Oral QHS Spencer E Simon, PA-C      . pantoprazole (PROTONIX) EC tablet 20 mg  20 mg Oral Daily Kerry Hough, PA-C   20 mg at 04/18/16 0830  . sucralfate (CARAFATE) 1 GM/10ML suspension 1 g  1 g Oral TID WC & HS Kerry Hough, PA-C   1 g at 04/18/16 1202  . traZODone (DESYREL) tablet 50 mg  50 mg Oral QHS,MR X 1 Spencer E Simon, PA-C       PTA Medications: Prescriptions Prior to Admission  Medication Sig Dispense Refill Last Dose  . ibuprofen (ADVIL,MOTRIN) 600 MG tablet Take 1 tablet (600 mg total) by mouth every 6 (six) hours as needed for pain. (Patient not taking: Reported on 08/16/2014) 30 tablet 0 Unknown at Unknown time  . ibuprofen (  ADVIL,MOTRIN) 800 MG tablet Take 1 tablet (800 mg total) by mouth 3 (three) times daily. (Patient not taking: Reported on 08/16/2014) 21 tablet 0 Unknown at Unknown time  . pantoprazole (PROTONIX) 20 MG tablet Take 1 tablet (20 mg total) by mouth daily. (Patient not taking: Reported on 02/17/2016) 30 tablet 0 Unknown at Unknown time  . sucralfate (CARAFATE) 1 GM/10ML suspension Take 10 mLs (1 g total) by mouth 4 (four) times daily -  with meals and at bedtime. (Patient not taking: Reported on 02/17/2016) 420 mL 0 Unknown at Unknown time    Musculoskeletal: Strength & Muscle Tone: within normal limits Gait & Station: normal Patient leans:  N/A  Psychiatric Specialty Exam: Physical Exam  Nursing note and vitals reviewed.   ROS  Blood pressure 113/77, pulse (!) 55, temperature 98.7 F (37.1 C), temperature source Oral, resp. rate 18, height 5\' 2"  (1.575 m), weight 48.5 kg (107 lb), last menstrual period 04/12/2016, SpO2 100 %.Body mass index is 19.57 kg/m.  General Appearance: Casual  Eye Contact:  Good  Speech:  Normal Rate  Volume:  Normal  Mood:  Anxious  Affect:  Appropriate  Thought Process:  Disorganized and Irrelevant  Orientation:  Full (Time, Place, and Person)  Thought Content:  Illogical, Rumination and Tangential  Suicidal Thoughts:  No  Homicidal Thoughts:  No  Memory:  Immediate;   Fair Recent;   Fair Remote;   Fair  Judgement:  Fair  Insight:  Fair  Psychomotor Activity:  Normal  Concentration:  Concentration: Poor and Attention Span: Poor  Recall:  [employer] of Knowledge:  Fair  Language:  Good  Akathisia:  No  Handed:  Right  AIMS (if indicated):     Assets:  Desire for Improvement Resilience Social Support  ADL's:  Intact  Cognition:  WNL  Sleep:  Number of Hours: 3 (Late admission)    Treatment Plan Summary: Admit for crisis management and mood stabilization. Medication management to re-stabilize current mood symptoms Group counseling sessions for coping skills Medical consults as needed Review and reinstate any pertinent home medications for other health problems   Observation Level/Precautions:  15 minute checks  Laboratory:  per ED  Psychotherapy:    Medications:    Consultations:    Discharge Concerns:    Estimated LOS:  Other:     Physician Treatment Plan for Primary Diagnosis: Severe major depression with psychotic features (HCC) Long Term Goal(s): Improvement in symptoms so as ready for discharge  Short Term Goals: Ability to identify changes in lifestyle to reduce recurrence of condition will improve, Ability to verbalize feelings will improve, Ability to disclose  and discuss suicidal ideas, Ability to demonstrate self-control will improve, Ability to identify and develop effective coping behaviors will improve, Ability to maintain clinical measurements within normal limits will improve, Compliance with prescribed medications will improve and Ability to identify triggers associated with substance abuse/mental health issues will improve  Physician Treatment Plan for Secondary Diagnosis: Principal Problem:   Severe major depression with psychotic features (HCC)  Long Term Goal(s): Improvement in symptoms so as ready for discharge  Short Term Goals: Ability to identify changes in lifestyle to reduce recurrence of condition will improve, Ability to verbalize feelings will improve, Ability to disclose and discuss suicidal ideas, Ability to demonstrate self-control will improve, Ability to identify and develop effective coping behaviors will improve, Ability to maintain clinical measurements within normal limits will improve, Compliance with prescribed medications will improve and Ability to identify triggers associated  with substance abuse/mental health issues will improve  I certify that inpatient services furnished can reasonably be expected to improve the patient's condition.    [facility] May Monita Swier, [place] 3/28/20183:47 PM

## 2016-04-19 LAB — HEMOGLOBIN A1C
HEMOGLOBIN A1C: 4.9 % (ref 4.8–5.6)
MEAN PLASMA GLUCOSE: 94 mg/dL

## 2016-04-19 LAB — PROLACTIN: Prolactin: 36.4 ng/mL — ABNORMAL HIGH (ref 4.8–23.3)

## 2016-04-19 NOTE — Progress Notes (Signed)
[facility] MD Progress Note  04/19/2016 4:09 PM Jordan Haley  MRN:  [id number] Subjective:  Patient states that she is tired today. Objective:  Jordan Lusackis an 26 y.o.female, [employer] student who developed   suicidal thoughts with a plan to stab self in the chest, stressed out by recent breakup with BF.  Patient was tangential, manic speech yesterday, she is more calm today however, somnolent during assessment.  She is compliant with meds and per nursing has no disruptive behavior.    Principal Problem: Schizoaffective disorder, bipolar type (HCC) Diagnosis:   Patient Active Problem List   Diagnosis Date Noted  . Severe major depression with psychotic features (HCC) [F32.3] 04/18/2016    Priority: High  . Schizoaffective disorder, bipolar type (HCC) [F25.0] 04/18/2016   Total Time spent with patient: 30 minutes  Past Psychiatric History: see HPI  Past Medical History:  Past Medical History:  Diagnosis Date  . Asthma     Past Surgical History:  Procedure Laterality Date  . NO PAST SURGERIES     Family History: History reviewed. No pertinent family history. Family Psychiatric  History: see HPI Social History:  History  Alcohol Use  . Yes    Comment: occasional; social     History  Drug Use  . Types: Marijuana    Social History   Social History  . Marital status: Single    Spouse name: N/A  . Number of children: N/A  . Years of education: N/A   Social History Main Topics  . Smoking status: Former Games developer  . Smokeless tobacco: Never Used  . Alcohol use Yes     Comment: occasional; social  . Drug use: Yes    Types: Marijuana  . Sexual activity: Yes    Birth control/ protection: None   Other Topics Concern  . None   Social History Narrative  . None   Additional Social History:    Pain Medications: See home med list Prescriptions: See home med list Over the Counter: See home med list History of alcohol / drug use?: Yes Negative Consequences of Use:  Legal Name of Substance 1: marijuana 1 - Age of First Use: teens 1 - Amount (size/oz): varies 1 - Frequency: occasionally 1 - Duration: ongoing 1 - Last Use / Amount: unknown                  Sleep: Fair  Appetite:  Fair  Current Medications: Current Facility-Administered Medications  Medication Dose Route Frequency Provider Last Rate Last Dose  . acetaminophen (TYLENOL) tablet 650 mg  650 mg Oral Q6H PRN Kerry Hough, PA-C      . alum & mag hydroxide-simeth (MAALOX/MYLANTA) 200-200-20 MG/5ML suspension 30 mL  30 mL Oral Q4H PRN Kerry Hough, PA-C      . ARIPiprazole (ABILIFY) tablet 10 mg  10 mg Oral Daily Georgiann Cocker, MD   10 mg at 04/19/16 0819  . haloperidol (HALDOL) tablet 5 mg  5 mg Oral Q6H PRN Georgiann Cocker, MD       Or  . haloperidol lactate (HALDOL) injection 5 mg  5 mg Intramuscular Q6H PRN Georgiann Cocker, MD      . hydrOXYzine (ATARAX/VISTARIL) tablet 25 mg  25 mg Oral TID PRN Kerry Hough, PA-C      . ibuprofen (ADVIL,MOTRIN) tablet 600 mg  600 mg Oral Q6H PRN Kerry Hough, PA-C      . LORazepam (ATIVAN) tablet 1 mg  1 mg Oral  PRN Kerry Hough, PA-C      . magnesium hydroxide (MILK OF MAGNESIA) suspension 30 mL  30 mL Oral Daily PRN Kerry Hough, PA-C      . pantoprazole (PROTONIX) EC tablet 20 mg  20 mg Oral Daily Kerry Hough, PA-C   20 mg at 04/19/16 0819  . sucralfate (CARAFATE) 1 GM/10ML suspension 1 g  1 g Oral TID WC & HS Kerry Hough, PA-C   1 g at 04/19/16 0819  . traZODone (DESYREL) tablet 50 mg  50 mg Oral QHS,MR X 1 Kerry Hough, PA-C        Lab Results:  Results for orders placed or performed during the hospital encounter of 04/18/16 (from the past 48 hour(s))  Pregnancy, urine     Status: None   Collection Time: 04/18/16  6:00 AM  Result Value Ref Range   Preg Test, Ur NEGATIVE NEGATIVE    Comment:        THE SENSITIVITY OF THIS METHODOLOGY IS >20 mIU/mL. Performed at [facility],  [address], [place], [place] [address]   TSH     Status: None   Collection Time: 04/18/16  6:14 AM  Result Value Ref Range   TSH 1.331 0.350 - 4.500 uIU/mL    Comment: Performed by a 3rd Generation assay with a functional sensitivity of <=0.01 uIU/mL. Performed at [facility], [address], [place], [place] [address]   Prolactin     Status: Abnormal   Collection Time: 04/18/16  6:14 AM  Result Value Ref Range   Prolactin 36.4 (H) 4.8 - 23.3 ng/mL    Comment: (NOTE) Performed At: [facility] [address] [place], [place] [id number] Mila Homer MD [id number] Performed at [facility], [address], [place], [place] [address]   Lipid panel     Status: None   Collection Time: 04/18/16  6:14 AM  Result Value Ref Range   Cholesterol 104 0 - 200 mg/dL   Triglycerides 56 <606 mg/dL   HDL 45 >30 mg/dL   Total CHOL/HDL Ratio 2.3 RATIO   VLDL 11 0 - 40 mg/dL   LDL Cholesterol 48 0 - 99 mg/dL    Comment:        Total Cholesterol/HDL:CHD Risk Coronary Heart Disease Risk Table                     Men   Women  1/2 Average Risk   3.4   3.3  Average Risk       5.0   4.4  2 X Average Risk   9.6   7.1  3 X Average Risk  23.4   11.0        Use the calculated Patient Ratio above and the CHD Risk Table to determine the patient's CHD Risk.        ATP III CLASSIFICATION (LDL):  <100     mg/dL   Optimal  160-109  mg/dL   Near or Above                    Optimal  130-159  mg/dL   Borderline  323-557  mg/dL   High  >322     mg/dL   Very High Performed at [facility], [address], [place], [place] [address]   Hemoglobin A1c     Status: None   Collection Time: 04/18/16  6:14  AM  Result Value Ref Range   Hgb A1c MFr Bld 4.9 4.8 - 5.6 %    Comment: (NOTE)         Pre-diabetes: 5.7 - 6.4         Diabetes: >6.4         Glycemic control for adults with diabetes: <7.0    Mean Plasma Glucose 94 mg/dL    Comment:  (NOTE) Performed At: [facility] [address] [place], [place] [id number] Mila HomerHancock William F MD [id number]:[phone] Performed at [facility], [address] [place], [place] [address]     Blood Alcohol level:  Lab Results  Component Value Date   ETH <5 04/17/2016    Metabolic Disorder Labs: Lab Results  Component Value Date   HGBA1C 4.9 04/18/2016   MPG 94 04/18/2016   Lab Results  Component Value Date   PROLACTIN 36.4 (H) 04/18/2016   Lab Results  Component Value Date   CHOL 104 04/18/2016   TRIG 56 04/18/2016   HDL 45 04/18/2016   CHOLHDL 2.3 04/18/2016   VLDL 11 04/18/2016   LDLCALC 48 04/18/2016    Physical Findings: AIMS: Facial and Oral Movements Muscles of Facial Expression: None, normal Lips and Perioral Area: None, normal Jaw: None, normal Tongue: None, normal,Extremity Movements Upper (arms, wrists, hands, fingers): None, normal Lower (legs, knees, ankles, toes): None, normal, Trunk Movements Neck, shoulders, hips: None, normal, Overall Severity Severity of abnormal movements (highest score from questions above): None, normal Incapacitation due to abnormal movements: None, normal Patient's awareness of abnormal movements (rate only patient's report): No Awareness, Dental Status Current problems with teeth and/or dentures?: No Does patient usually wear dentures?: No  CIWA:    COWS:     Musculoskeletal: Strength & Muscle Tone: within normal limits Gait & Station: normal Patient leans: N/A  Psychiatric Specialty Exam: Physical Exam  Nursing note reviewed.   ROS  Blood pressure 113/77, pulse (!) 55, temperature 98.7 F (37.1 C), temperature source Oral, resp. rate 18, height 5\' 2"  (1.575 m), weight 48.5 kg (107 lb), last menstrual period 04/12/2016, SpO2 100 %.Body mass index is 19.57 kg/m.  General Appearance: Casual  Eye Contact:  Good  Speech:  Clear and Coherent  Volume:  Normal  Mood:  Anxious and Depressed   Affect:  Appropriate  Thought Process:  Disorganized  Orientation:  Full (Time, Place, and Person)  Thought Content:  Tangential  Suicidal Thoughts:  No  Homicidal Thoughts:  No  Memory:  Immediate;   Fair Recent;   Fair Remote;   Fair  Judgement:  Fair  Insight:  Fair  Psychomotor Activity:  Normal  Concentration:  Concentration: Fair and Attention Span: Fair  Recall:  FiservFair  Fund of Knowledge:  Fair  Language:  Fair  Akathisia:  No  Handed:  Right  AIMS (if indicated):     Assets:  Desire for Improvement Resilience  ADL's:  Intact  Cognition:  WNL  Sleep:  Number of Hours: 3 (Late admission)   Treatment Plan Summary: Review of chart, vital signs, medications, and notes.  1-Individual and group therapy  2-Medication management for depression and anxiety: Medications reviewed with the patient.  Abilify 10 mg daily mood stabilization 3-Coping skills for depression, anxiety  4-Continue crisis stabilization and management  5-Address health issues--monitoring vital signs, stable  6-Treatment plan in progress to prevent relapse of depression and anxiety 7 - labs reviewed, discussed plan of care with Dr Sandi Mariscalobos  Anusha Claus Patria ManeMay Kaylum Shrum, NP  BC 04/19/2016, 4:09 PM

## 2016-04-19 NOTE — [facility] Group Notes (Signed)
Type of Therapy:  Group Therapy   Participation Level:  Engaged  Participation Quality:  Attentive  Affect:  Appropriate   Cognitive:  Alert   Insight:  Engaged  Engagement in Therapy:  Improving   Mode s of Intervention:  Education, Exploration, Socialization   Summary of Progress/Problems: Engaged throughout, stayed entire time.   Jordan Haley from [facility] [employer] was here to tell her story of recovery and inform patients about [facility] and their services.     

## 2016-04-19 NOTE — Progress Notes (Signed)
Pt has been in her room most of the shift reading and writing in the journal given to her this morning.  She reports that her day has been good.  She has spent a lot of time talking on the phone this evening.  She voices no needs or concerns at this time.  She would prefer not to take anything for sleep tonight.  She says the carafate is really helping her stomach pain.  Her mood is euphoric.  Her thoughts are tangential with rapid, pressured speech.  She asks appropriate questions and wants to learn coping skills to help her deal with disappointments in life.  She denies SI/HI/AVH.  Support and encouragement offered.  Discharge plans are in process.  Safety maintained with q15 minute checks.

## 2016-04-19 NOTE — Progress Notes (Signed)
Pt reports she is feeling anxious tonight because she needs to discharge tomorrow to get back to work so she can pay her bills and get ready for exams.  She says after this semester she is going to take a break from school to hopefully reduce her stress.  She says she was told it might be Sunday before she would discharge.  She says she feels better and more in control of her thoughts.  She denies SI/HI/AVH.  She says the conflict with her friend is resolved.  She has been cooperative and pleasant on the unit with staff and peers.  Support and encouragement offered.  Pt was encouraged to speak with the doctor about her concerns.  Discharge plans are in process.  Safety maintained with q15 minute checks.

## 2016-04-19 NOTE — Progress Notes (Signed)
Recreation Therapy Notes  Date: 04/19/16 Time: 1000 Location: 500 Hall Dayroom  Group Topic: Communication, Team Building, Problem Solving  Goal Area(s) Addresses:  Patient will effectively work with peer towards shared goal.  Patient will identify skills used to make activity successful.  Patient will identify how skills used during activity can be used to reach post d/c goals.   Behavioral Response: Engaged  Intervention: STEM Activity  Activity: Stage managerLanding Pad. In teams patients were given 12 plastic drinking straws and a length of masking tape. Using the materials provided patients were asked to build a landing pad to catch a golf ball dropped from approximately 6 feet in the air.   Education: Pharmacist, communityocial Skills, Discharge Planning   Education Outcome: Acknowledges education/In group clarification offered/Needs additional education.   Clinical Observations/Feedback: Pt was bright and engaged but lacked confidence in herself.  Pt expressed the group could have communicated better in the beginning.  Pt also stated she could have been more clear and spoke up about her ideas.   Caroll RancherMarjette Margurite Duffy, LRT/CTRS     Caroll RancherLindsay, Marisah Laker A 04/19/2016 12:18 PM

## 2016-04-19 NOTE — Progress Notes (Signed)
D: Pt continues to be very flat and depressed on the unit today. Pt also continues to be very isolative Stayed in the room most of the time.Pt reported this morning that she was ready for discharge,pt sated she has exams coming, bills to pay, and also to go back work.. Pt reported that her depression was a 0, his hopelessness was a 0, and that his anxiety was a 1. Pt reported being negative SI/HI, no AH/VH noted. A: 15 min checks continued for patient safety. R: Pts safety maintained.

## 2016-04-20 DIAGNOSIS — Z79899 Other long term (current) drug therapy: Secondary | ICD-10-CM

## 2016-04-20 DIAGNOSIS — F250 Schizoaffective disorder, bipolar type: Secondary | ICD-10-CM

## 2016-04-20 DIAGNOSIS — Z87891 Personal history of nicotine dependence: Secondary | ICD-10-CM

## 2016-04-20 DIAGNOSIS — F1290 Cannabis use, unspecified, uncomplicated: Secondary | ICD-10-CM

## 2016-04-20 NOTE — Plan of Care (Signed)
Problem: Safety: Goal: Periods of time without injury will increase Outcome: Progressing Pt safe on the unit at this time   

## 2016-04-20 NOTE — Progress Notes (Signed)
D: Pt denies SI/HI/AVH. Pt is pleasant and very animated, hyperverbal. Pt stated she had a good day and is focused on her D/C. Pt appears to have no focus on anything, her mind is scattered with various ideas and she goes from one to the other even before she finishes her thoughts.   A: Pt was offered support and encouragement.  Pt was encourage to attend groups. Q 15 minute checks were done for safety.   R:Pt attends groups and interacts well with peers and staff. Pt receptive to treatment and safety maintained on unit.

## 2016-04-20 NOTE — Progress Notes (Signed)
Adult Psychoeducational Group Note  Date:  04/20/2016 Time:  9:52 PM  Group Topic/Focus:  Wrap-Up Group:   The focus of this group is to help patients review their daily goal of treatment and discuss progress on daily workbooks.  Participation Level:  Active  Participation Quality:  Appropriate  Affect:  Appropriate  Cognitive:  Appropriate  Insight: Appropriate  Engagement in Group:  Engaged  Modes of Intervention:  Discussion  Additional Comments:  The patient expressed that she rates today a 10.The patient also said that support group was very positive for her.  Octavio Manns 04/20/2016, 9:52 PM

## 2016-04-20 NOTE — Progress Notes (Signed)
Recreation Therapy Notes  Date: 04/20/16 Time: 1000 Location: 500 Hall Dayroom  Group Topic: Leisure Education  Goal Area(s) Addresses:  Patient will identify positive leisure activities.  Patient will identify one positive benefit of participation in leisure activities.   Behavioral Response: Engaged  Intervention: Leisure activities, can, dry erase marker, dry erase board, eraser  Activity: Leisure Pictionary.  One patient would come up and draw a slip from the can containing a leisure activity on it.  The patient is to then draw the activity on the board.  The remaining patients are to try and guess the picture.  The person that guesses the picture will get the next turn.  Education:  Leisure Education, Building control surveyor  Education Outcome: Acknowledges education/In group clarification offered/Needs additional education  Clinical Observations/Feedback: Pt was bright and active during group.  Pt also interacted well with peers.  Pt stated doing more leisure would allow her the opportunity to "learn different perspectives, spend time with friends and be more relaxed".   Jordan Haley, LRT/CTRS     Jordan Haley A 04/20/2016 12:17 PM

## 2016-04-20 NOTE — Progress Notes (Signed)
  [facility] Adult Case Management Discharge Plan :  Will you be returning to the same living situation after discharge:  Yes,  me At discharge, do you have transportation home?: Yes,  bus Do you have the ability to pay for your medications: Yes,  [employer] [facility]  Release of information consent forms completed and in the chart;  Patient's signature needed at discharge.  Patient to Follow up at: Follow-up Information    [facility]. Go on 04/27/2016.   Why:  Crisis Management Meeting is Friday, April 27, 2016 at 10:00am  Then, Friday, May 04, 2016 at 10:00am with Landis Martins Either walk in or call to the counseling center on Monday and talk to [place] about what you should do until your Friday meeting Contact information: [address] [place] [address] Telephone: [phone] Fax: [phone]          Next level of care provider has access to [facility] Link:no  Safety Planning and Suicide Prevention discussed: Yes,  yes  Have you used any form of tobacco in the last 30 days? (Cigarettes, Smokeless Tobacco, Cigars, and/or Pipes): No  Has patient been referred to the Quitline?: N/A patient is not a smoker  Patient has been referred for addiction treatment: N/A  Ida Rogue 04/20/2016, 11:20 AM

## 2016-04-20 NOTE — [facility] Group Notes (Signed)
[facility] LCSW Group Therapy  04/20/2016  1:05 PM  Type of Therapy:  Group therapy  Participation Level:  Active  Participation Quality:  Attentive  Affect:  Flat  Cognitive:  Oriented  Insight:  Limited  Engagement in Therapy:  Limited  Modes of Intervention:  Discussion, Socialization  Summary of Progress/Problems:  Chaplain was here to lead a group on themes of hope and courage. "Support is a solid person that will be there no matter what, and will not judge. I don't have to apologize for anything, and there is feeedom and safety to be who I am."  Went on to say she does not really have anyone like that.  "But I feel like I have support through God.  Myfaith carries me through."  Used the analogy of her time here as climbing a staircase.  "We are all trying to higher ground, to a better place. We reflect each other here."  Stayed the entire time, engaged throughout.  Daryel Gerald B 04/20/2016 1:03 PM

## 2016-04-20 NOTE — Tx Team (Signed)
Interdisciplinary Treatment and Diagnostic Plan Update  04/20/2016 Time of Session: 11:14 AM  Jordan Haley MRN: [id number]  Principal Diagnosis: Schizoaffective disorder, bipolar type (HCC)  Secondary Diagnoses: Principal Problem:   Schizoaffective disorder, bipolar type (HCC) Active Problems:   Severe major depression with psychotic features (HCC)   Current Medications:  Current Facility-Administered Medications  Medication Dose Route Frequency Provider Last Rate Last Dose  . acetaminophen (TYLENOL) tablet 650 mg  650 mg Oral Q6H PRN Kerry Hough, PA-C      . alum & mag hydroxide-simeth (MAALOX/MYLANTA) 200-200-20 MG/5ML suspension 30 mL  30 mL Oral Q4H PRN Kerry Hough, PA-C      . ARIPiprazole (ABILIFY) tablet 10 mg  10 mg Oral Daily Georgiann Cocker, MD   10 mg at 04/20/16 0855  . haloperidol (HALDOL) tablet 5 mg  5 mg Oral Q6H PRN Georgiann Cocker, MD       Or  . haloperidol lactate (HALDOL) injection 5 mg  5 mg Intramuscular Q6H PRN Georgiann Cocker, MD      . hydrOXYzine (ATARAX/VISTARIL) tablet 25 mg  25 mg Oral TID PRN Kerry Hough, PA-C      . ibuprofen (ADVIL,MOTRIN) tablet 600 mg  600 mg Oral Q6H PRN Kerry Hough, PA-C      . LORazepam (ATIVAN) tablet 1 mg  1 mg Oral PRN Kerry Hough, PA-C      . magnesium hydroxide (MILK OF MAGNESIA) suspension 30 mL  30 mL Oral Daily PRN Kerry Hough, PA-C      . pantoprazole (PROTONIX) EC tablet 20 mg  20 mg Oral Daily Kerry Hough, PA-C   20 mg at 04/20/16 0855  . sucralfate (CARAFATE) 1 GM/10ML suspension 1 g  1 g Oral TID WC & HS Kerry Hough, PA-C   1 g at 04/20/16 0854  . traZODone (DESYREL) tablet 50 mg  50 mg Oral QHS,MR X 1 Spencer E Simon, PA-C        PTA Medications: Prescriptions Prior to Admission  Medication Sig Dispense Refill Last Dose  . ibuprofen (ADVIL,MOTRIN) 600 MG tablet Take 1 tablet (600 mg total) by mouth every 6 (six) hours as needed for pain. (Patient not taking: Reported on  08/16/2014) 30 tablet 0 Unknown at Unknown time  . ibuprofen (ADVIL,MOTRIN) 800 MG tablet Take 1 tablet (800 mg total) by mouth 3 (three) times daily. (Patient not taking: Reported on 08/16/2014) 21 tablet 0 Unknown at Unknown time  . pantoprazole (PROTONIX) 20 MG tablet Take 1 tablet (20 mg total) by mouth daily. (Patient not taking: Reported on 02/17/2016) 30 tablet 0 Unknown at Unknown time  . sucralfate (CARAFATE) 1 GM/10ML suspension Take 10 mLs (1 g total) by mouth 4 (four) times daily -  with meals and at bedtime. (Patient not taking: Reported on 02/17/2016) 420 mL 0 Unknown at Unknown time    Treatment Modalities: Medication Management, Group therapy, Case management,  1 to 1 session with clinician, Psychoeducation, Recreational therapy.   Physician Treatment Plan for Primary Diagnosis: Schizoaffective disorder, bipolar type (HCC) Long Term Goal(s): Improvement in symptoms so as ready for discharge  Short Term Goals: Ability to identify changes in lifestyle to reduce recurrence of condition will improve and Ability to disclose and discuss suicidal ideas  Medication Management: Evaluate patient's response, side effects, and tolerance of medication regimen.  Therapeutic Interventions: 1 to 1 sessions, Unit Group sessions and Medication administration.  Evaluation of Outcomes: Adequate for Discharge  Physician Treatment Plan for Secondary Diagnosis: Principal Problem:   Schizoaffective disorder, bipolar type (HCC) Active Problems:   Severe major depression with psychotic features (HCC)   Long Term Goal(s): Improvement in symptoms so as ready for discharge  Short Term Goals: Compliance with prescribed medications will improve  Medication Management: Evaluate patient's response, side effects, and tolerance of medication regimen.  Therapeutic Interventions: 1 to 1 sessions, Unit Group sessions and Medication administration.  Evaluation of Outcomes: Adequate for Discharge   RN  Treatment Plan for Primary Diagnosis: Schizoaffective disorder, bipolar type (HCC) Long Term Goal(s): Knowledge of disease and therapeutic regimen to maintain health will improve  Short Term Goals: Ability to disclose and discuss suicidal ideas and Compliance with prescribed medications will improve  Medication Management: RN will administer medications as ordered by provider, will assess and evaluate patient's response and provide education to patient for prescribed medication. RN will report any adverse and/or side effects to prescribing provider.  Therapeutic Interventions: 1 on 1 counseling sessions, Psychoeducation, Medication administration, Evaluate responses to treatment, Monitor vital signs and CBGs as ordered, Perform/monitor CIWA, COWS, AIMS and Fall Risk screenings as ordered, Perform wound care treatments as ordered.  Evaluation of Outcomes: Adequate for Discharge   Recreational Therapy Treatment Plan for Primary Diagnosis: Schizoaffective disorder, bipolar type (HCC) Long Term Goal(s): Patient will participate in recreation therapy treatment in at least 2 group sessions without prompting from LRT  Short Term Goals: Patient will be able to identify at least 5 coping skills for admitting diagnosis by conclusion of recreation therapy treatment  Treatment Modalities: Group and Pet Therapy  Therapeutic Interventions: Psychoeducation  Evaluation of Outcomes: Progressing   LCSW Treatment Plan for Primary Diagnosis: Schizoaffective disorder, bipolar type (HCC) Long Term Goal(s): Safe transition to appropriate next level of care at discharge, Engage patient in therapeutic group addressing interpersonal concerns.  Short Term Goals: Engage patient in aftercare planning with referrals and resources and Increase skills for wellness and recovery  Therapeutic Interventions: Assess for all discharge needs, 1 to 1 time with Social worker, Explore available resources and support systems,  Assess for adequacy in community support network, Educate family and significant other(s) on suicide prevention, Complete Psychosocial Assessment, Interpersonal group therapy.  Evaluation of Outcomes: Adequate for Discharge   Progress in Treatment: Attending groups: Yes Participating in groups: Yes Taking medication as prescribed: Yes, MD continues to assess for medication changes as needed Toleration medication: Yes, no side effects reported at this time Family/Significant other contact made: No, there are no supports who are locally available at this time.  Patient understands diagnosis: Limited insight  Discussing patient identified problems/goals with staff: Yes Medical problems stabilized or resolved: Yes Denies suicidal/homicidal ideation: No  Issues/concerns per patient self-inventory: None Other: N/A  New problem(s) identified: None identified at this time.   New Short Term/Long Term Goal(s): None identified at this time.   Discharge Plan or Barriers: Return home and follow up with [facility]   Reason for Continuation of Hospitalization:   Medication stabilization    Estimated Length of Stay: [place] d/c tomorrow  Attendees: Patient: 04/20/2016  11:14 AM  Physician:Fernando Cobos 04/20/2016  11:14 AM  Nursing:  Esperanza Sheets, RN  04/20/2016  11:14 AM  RN Care Manager: Onnie Boer 04/20/2016  11:14 AM  Social Worker: Richelle Ito, LCSW 04/20/2016  11:14 AM  Recreational Therapist: Aggie Cosier 04/20/2016  11:14 AM  Other: Baldo Daub, Social Work Intern  04/20/2016  11:14 AM  Other:  04/20/2016  11:14  AM  Other: 04/20/2016  11:14 AM    Scribe for Treatment Team: Baldo Daub, Social Work Intern 04/20/2016 11:14 AM

## 2016-04-20 NOTE — Progress Notes (Signed)
[facility] MD Progress Note  04/20/2016 10:57 AM Jordan Haley  MRN:  [id number] Subjective: Patient reports feeling much better today.As she improves she is focusing more on discharge planning and states she is hoping to be discharged soon. States she needs to return to work soon to avoid financial difficulties. Denies medication side effects. Objective: I have discussed case with treatment team and have met with patient. Today patient reports improved mood , feels " better", and presents calm, pleasant , with a reactive affect. Denies any suicidal ideations .  Behavior on unit calm and in good control. Visible in milieu. She reports a history of chronic abdominal RLQ pain, discomfort , and states she has felt ( for several months to years ) that " it is as if something was stuck inside there". State she plans to see a gastroenterologist for further work up . Denies medication side effects- currently on Abilify.   Principal Problem: Schizoaffective disorder, bipolar type ([place]) Diagnosis:   Patient Active Problem List   Diagnosis Date Noted  . Severe major depression with psychotic features ([place]) [F32.3] 04/18/2016  . Schizoaffective disorder, bipolar type ([place]) [F25.0] 04/18/2016   Total Time spent with patient: 20 minutes  Past Psychiatric History: see HPI  Past Medical History:  Past Medical History:  Diagnosis Date  . Asthma     Past Surgical History:  Procedure Laterality Date  . NO PAST SURGERIES     Family History: History reviewed. No pertinent family history. Family Psychiatric  History: see HPI Social History:  History  Alcohol Use  . Yes    Comment: occasional; social     History  Drug Use  . Types: Marijuana    Social History   Social History  . Marital status: Single    Spouse name: N/A  . Number of children: N/A  . Years of education: N/A   Social History Main Topics  . Smoking status: Former Research scientist (life sciences)  . Smokeless tobacco: Never Used  . Alcohol use Yes      Comment: occasional; social  . Drug use: Yes    Types: Marijuana  . Sexual activity: Yes    Birth control/ protection: None   Other Topics Concern  . None   Social History Narrative  . None   Additional Social History:    Pain Medications: See home med list Prescriptions: See home med list Over the Counter: See home med list History of alcohol / drug use?: Yes Negative Consequences of Use: Legal Name of Substance 1: marijuana 1 - Age of First Use: teens 1 - Amount (size/oz): varies 1 - Frequency: occasionally 1 - Duration: ongoing 1 - Last Use / Amount: unknown  Sleep: Good  Appetite:  improving   Current Medications: Current Facility-Administered Medications  Medication Dose Route Frequency Provider Last Rate Last Dose  . acetaminophen (TYLENOL) tablet 650 mg  650 mg Oral Q6H PRN Laverle Hobby, PA-C      . alum & mag hydroxide-simeth (MAALOX/MYLANTA) 200-200-20 MG/5ML suspension 30 mL  30 mL Oral Q4H PRN Laverle Hobby, PA-C      . ARIPiprazole (ABILIFY) tablet 10 mg  10 mg Oral Daily Artist Beach, MD   10 mg at 04/20/16 0855  . haloperidol (HALDOL) tablet 5 mg  5 mg Oral Q6H PRN Artist Beach, MD       Or  . haloperidol lactate (HALDOL) injection 5 mg  5 mg Intramuscular Q6H PRN Artist Beach, MD      .  hydrOXYzine (ATARAX/VISTARIL) tablet 25 mg  25 mg Oral TID PRN Spencer E Simon, PA-C      . ibuprofen (ADVIL,MOTRIN) tablet 600 mg  600 mg Oral Q6H PRN Spencer E Simon, PA-C      . LORazepam (ATIVAN) tablet 1 mg  1 mg Oral PRN Spencer E Simon, PA-C      . magnesium hydroxide (MILK OF MAGNESIA) suspension 30 mL  30 mL Oral Daily PRN Spencer E Simon, PA-C      . pantoprazole (PROTONIX) EC tablet 20 mg  20 mg Oral Daily Spencer E Simon, PA-C   20 mg at 04/20/16 0855  . sucralfate (CARAFATE) 1 GM/10ML suspension 1 g  1 g Oral TID WC & HS Spencer E Simon, PA-C   1 g at 04/20/16 0854  . traZODone (DESYREL) tablet 50 mg  50 mg Oral QHS,MR X 1 Spencer E  Simon, PA-C        Lab Results:  No results found for this or any previous visit (from the past 48 hour(s)).  Blood Alcohol level:  Lab Results  Component Value Date   ETH <5 04/17/2016    Metabolic Disorder Labs: Lab Results  Component Value Date   HGBA1C 4.9 04/18/2016   MPG 94 04/18/2016   Lab Results  Component Value Date   PROLACTIN 36.4 (H) 04/18/2016   Lab Results  Component Value Date   CHOL 104 04/18/2016   TRIG 56 04/18/2016   HDL 45 04/18/2016   CHOLHDL 2.3 04/18/2016   VLDL 11 04/18/2016   LDLCALC 48 04/18/2016    Physical Findings: AIMS: Facial and Oral Movements Muscles of Facial Expression: None, normal Lips and Perioral Area: None, normal Jaw: None, normal Tongue: None, normal,Extremity Movements Upper (arms, wrists, hands, fingers): None, normal Lower (legs, knees, ankles, toes): None, normal, Trunk Movements Neck, shoulders, hips: None, normal, Overall Severity Severity of abnormal movements (highest score from questions above): None, normal Incapacitation due to abnormal movements: None, normal Patient's awareness of abnormal movements (rate only patient's report): No Awareness, Dental Status Current problems with teeth and/or dentures?: No Does patient usually wear dentures?: No  CIWA:    COWS:     Musculoskeletal: Strength & Muscle Tone: within normal limits Gait & Station: normal Patient leans: N/A  Psychiatric Specialty Exam: Physical Exam  Nursing note reviewed.   ROS chronic abdominal pain as above, denies nausea, denies vomiting   Blood pressure 95/71, pulse 93, temperature 99 F (37.2 C), temperature source Oral, resp. rate 18, height 5' 2" (1.575 m), weight 48.5 kg (107 lb), last menstrual period 04/12/2016, SpO2 100 %.Body mass index is 19.57 kg/m.  General Appearance: improved grooming   Eye Contact:  Good  Speech:  Normal Rate  Volume:  Normal  Mood:  reports she is feeling better, minimizes depression at this time   Affect:  Appropriate and Full Range  Thought Process: better organized, more linear   Orientation:  Other:  fully alert and attentive   Thought Content:  denies hallucinations, no delusions expressed today  Suicidal Thoughts:  No denies any suicidal or self injurious ideations, denies any homicidal or violent ideations   Homicidal Thoughts:  No  Memory:   Recent and remote grossly intact   Judgement:  Other:  improving   Insight:  improving   Psychomotor Activity:  Normal  Concentration:  Concentration: Good and Attention Span: Good  Recall:  Good  Fund of Knowledge:  Good  Language:  Good  Akathisia:  Negative    Handed:  Right  AIMS (if indicated):     Assets:  Communication Skills Desire for Improvement Physical Health Resilience  ADL's:  Intact  Cognition:  WNL  Sleep:  Number of Hours: 6.5   Assessment - patient is reporting improvement and presents with improved mood, improved range of affect, focusing on discharging soon in order to return to daily activities, work. Denies medication side effects.   Treatment Plan Summary: Treatment plan reviewed as below today 3/30 1-Encourage ongoing group and milieu participation to work on coping skills and symptom reduction 2-Continue Abilify 10 mg daily for mood disorder  3-Continue Vistaril 25 mgrsd Q 8 hours PRN for anxiety  4-Continue Trazodone 50 mgrs QHS PRN for insomnia as needed  5-Treatment team working on disposition planning options   As above, patient states she plans to follow up as an outpatient with GI specialist for further work up and management of her chronic abdominal pain    A , MD  04/20/2016, 10:57 AM   Patient ID: Jordan Haley, female   DOB: 11/03/1990, 25 y.o.   MRN: [id number]  

## 2016-04-20 NOTE — Progress Notes (Signed)
DAR NOTE: Patient presents with euphoricaffect and mood.  Denies pain, auditory and visual hallucinations.  Patient was preoccupied with getting discharge and was concerned about school.  Reports joggling school and work are some of her stressors.  Described energy level as normal and concentration as good.  Rates depression at 0, hopelessness at 0, and anxiety at 0.  Maintained on routine safety checks.  Patient required a lot of encouragement before taking her medication.  Support and encouragement offered as needed.  Attended group and participated.  States goal for today is "life, liberty and the pursuit of happiness."  Patient observed socializing with peers in the dayroom.  Offered no complaint.

## 2016-04-21 MED ORDER — ARIPIPRAZOLE 10 MG PO TABS
10.0000 mg | ORAL_TABLET | Freq: Once | ORAL | Status: DC
  Filled 2016-04-21: qty 1

## 2016-04-21 MED ORDER — ARIPIPRAZOLE 10 MG PO TABS: 10.0000 mg | ORAL_TABLET | Freq: Every day | ORAL | 0 refills | Status: DC

## 2016-04-21 MED ORDER — TRAZODONE HCL 50 MG PO TABS: 50.0000 mg | ORAL_TABLET | Freq: Every evening | ORAL | 0 refills | Status: DC | PRN

## 2016-04-21 MED ORDER — HYDROXYZINE HCL 25 MG PO TABS: 25.0000 mg | ORAL_TABLET | Freq: Three times a day (TID) | ORAL | 0 refills | Status: DC | PRN

## 2016-04-21 MED ORDER — ARIPIPRAZOLE 10 MG PO TABS
20.0000 mg | ORAL_TABLET | Freq: Every day | ORAL | Status: DC
  Administered 2016-04-23: 20 mg via ORAL
  Filled 2016-04-21 (×4): qty 2
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
## 2016-04-21 NOTE — Discharge Summary (Addendum)
Physician Discharge Summary Note  Patient:  Jordan Haley is an 26 y.o., female MRN:  [id number] DOB:  05-21-1990 Patient phone:  [phone] (home)  Patient address:   [address] [place] [address],  Total Time spent with patient: 30 minutes  Date of Admission:  04/18/2016 Date of Discharge:04/21/2016  Reason for Admission:Per HPI- Jordan Lusackis an 26 y.o.female. She presents to [facility], voluntarily. She was transported to [facility] by [facility]. Patient was also accompanied by a Field seismologist. She has suicidal thoughts with a plan to stab self in the chest. She was seen today and she was pleasant however tangential and spoke with pressured speech.  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  She states that she needs to "focus on myself holistically, I drink many teas, I go yoga, it helps me forgive myself because I have a lot of guilt.  I used to do yoga, but something is wrong with my stomach.   She reports that she was with a friend and then later a friend who was [place] who wanted to be more than just friends.  Then patient would start talking about taking holistic studies.  She sasks this NP for a list of schools that could help her study to be a provider for holistic healing.  ngoing suicidal thoughts worsening after a breakup with a boyfriend.  In the chart and the patient did not mention this"  Sts that she recently found out her boyfriend was dating a new girl. She saw pictures and videor of the exboyfriend with his new girlfriend. Sts that this made her increasingly upset and now she has distorted thoughts. Sts that she acquired a STD and a stuck tampon yrs ago and has since suffered from chronic pain issues in her stomach. She is in constant agonizing stomach pain from those two incidents. Patient sts that she feels helpless and isolates herself from others. She denies HI. She is currently calm and cooperative. No legal  issues. She attends school at [employer] and works at [employer]. She also reports auditory hallucinations of voices telling her demeaning things about herself. The voices tell her, "You are ugly and worthless". Patient has pressured speech and flight of ideas. She has no current outpatient therapist or psychiatrist. NO history of INPT mental health history. No history of alcohol or drug use. UDS and BAL are both negative.    Principal Problem: Substance-induced psychotic disorder with delusions [facility] Discharge Diagnoses: Patient Active Problem List   Diagnosis Date Noted  . 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 Cannabis use disorder, moderate, dependence (HCC) [F12.20] 04/23/2016  . Adjustment disorder with mixed anxiety and depressed mood [F43.23] 04/23/2016    Past Psychiatric History:   Past Medical History:  Past Medical History:  Diagnosis Date  . Asthma     Past Surgical History:  Procedure Laterality Date  . NO PAST SURGERIES     Family History:  Family History  Problem Relation Age of Onset  . Mental illness Neg Hx    Family Psychiatric  History:  Social History:  History  Alcohol Use  . Yes    Comment: occasional; social     History  Drug Use  . Types: Marijuana    Social History   Social History  . Marital status: Single    Spouse name: N/A  . Number of children: N/A  . Years of education: N/A  Social History Main Topics  . Smoking status: Former Games developer  . Smokeless tobacco: Never Used  . Alcohol use Yes     Comment: occasional; social  . Drug use: Yes    Types: Marijuana  . Sexual activity: Yes    Birth control/ protection: None   Other Topics Concern  . None   Social History Narrative  . None    Hospital Course:  Jordan Haley was admitted for Substance-induced psychotic disorder with delusions (HCC)  and crisis management.  Pt was treated discharged with the medications listed below under Medication  List.  Medical problems were identified and treated as needed.  Home medications were restarted as appropriate.  Improvement was monitored by observation and Jordan Haley 's daily report of symptom reduction.  Emotional and mental status was monitored by daily self-inventory reports completed by Jordan Haley and clinical staff.         Jordan Haley was evaluated by the treatment team for stability and plans for continued recovery upon discharge. Jordan Haley 's motivation was an integral factor for scheduling further treatment. Employment, transportation, bed availability, health status, family support, and any pending legal issues were also considered during hospital stay. Pt was offered further treatment options upon discharge including but not limited to Residential, Intensive Outpatient, and Outpatient treatment.  Jordan Haley will follow up with the services as listed below under Follow Up Information.     Upon completion of this admission the patient was both mentally and medically stable for discharge denying suicidal/homicidal ideation, auditory/visual/tactile hallucinations, delusional thoughts and paranoia.    Jordan Haley responded well to treatment with Abilify 10 mg and Trazodone 50 mg without adverse effects. Pt demonstrated improvement without reported or observed adverse effects to the point of stability appropriate for outpatient management. Pertinent labs include: CBC, CMP and Prolactin 36.4(high) for which outpatient follow-up is necessary for lab recheck as mentioned below. Reviewed CBC, CMP, BAL, and UDS; all unremarkable aside from noted exceptions.   Physical Findings: AIMS: Facial and Oral Movements Muscles of Facial Expression: None, normal Lips and Perioral Area: None, normal Jaw: None, normal Tongue: None, normal,Extremity Movements Upper (arms, wrists, hands, fingers): None, normal Lower (legs, knees, ankles, toes): None, normal, Trunk Movements Neck,  shoulders, hips: None, normal, Overall Severity Severity of abnormal movements (highest score from questions above): None, normal Incapacitation due to abnormal movements: None, normal Patient's awareness of abnormal movements (rate only patient's report): No Awareness, Dental Status Current problems with teeth and/or dentures?: No Does patient usually wear dentures?: No  CIWA:  CIWA-Ar Total: 1 COWS:  COWS Total Score: 1  Musculoskeletal: Strength & Muscle Tone: within normal limits Gait & Station: normal Patient leans: N/A  Psychiatric Specialty Exam: See SRA by MD Physical Exam  Vitals reviewed. Constitutional: She is oriented to person, place, and time. She appears well-developed.  Cardiovascular: Normal rate.   Neurological: She is alert and oriented to person, place, and time.  Psychiatric: She has a normal mood and affect. Her behavior is normal.    Review of Systems  Psychiatric/Behavioral: Depression: stable. Nervous/anxious: stable.     Blood pressure 97/65, pulse 75, temperature 98 F (36.7 C), temperature source Oral, resp. rate 16, height  (1.575 m), weight 48.5 kg (107 lb), last menstrual period 04/12/2016, SpO2 100 %.Body mass index is 19.57 kg/m.    Have you used any form of tobacco in the last 30 days? (Cigarettes, Smokeless Tobacco, Cigars, and/or Pipes): No  Has this patient used any  form of tobacco in the last 30 days? (Cigarettes, Smokeless Tobacco, Cigars, and/or Pipes)  No  Blood Alcohol level:  Lab Results  Component Value Date   ETH <5 04/17/2016    Metabolic Disorder Labs:  Lab Results  Component Value Date   HGBA1C 4.9 04/18/2016   MPG 94 04/18/2016   Lab Results  Component Value Date   PROLACTIN 36.4 (H) 04/18/2016   Lab Results  Component Value Date   CHOL 104 04/18/2016   TRIG 56 04/18/2016   HDL 45 04/18/2016   CHOLHDL 2.3 04/18/2016   VLDL 11 04/18/2016   LDLCALC 48 04/18/2016    See Psychiatric Specialty Exam and Suicide  Risk Assessment completed by Attending Physician prior to discharge.  Discharge destination:  Home  Is patient on multiple antipsychotic therapies at discharge:  No   Has Patient had three or more failed trials of antipsychotic monotherapy by history:  No  Recommended Plan for Multiple Antipsychotic Therapies: NA  Discharge Instructions    Diet - low sodium heart healthy    Complete by:  As directed    Discharge instructions    Complete by:  As directed    Take all medications as prescribed. Keep all follow-up appointments as scheduled.  Do not consume alcohol or use illegal drugs while on prescription medications. Report any adverse effects from your medications to your primary care provider promptly.  In the event of recurrent symptoms or worsening symptoms, call 911, a crisis hotline, or go to the nearest emergency department for evaluation.   Increase activity slowly    Complete by:  As directed      Allergies as of 04/23/2016   No Known Allergies     Medication List    STOP taking these medications   ibuprofen 600 MG tablet Commonly known as:  ADVIL,MOTRIN   ibuprofen 800 MG tablet Commonly known as:  ADVIL,MOTRIN     TAKE these medications     Indication  ARIPiprazole 20 MG tablet Commonly known as:  ABILIFY Take 1 tablet (20 mg total) by mouth daily. Start taking on:  04/24/2016  Indication:  mood stabilization   hydrOXYzine 25 MG tablet Commonly known as:  ATARAX/VISTARIL Take 1 tablet (25 mg total) by mouth 3 (three) times daily as needed for anxiety.  Indication:  Anxiety Neurosis   pantoprazole 20 MG tablet Commonly known as:  PROTONIX Take 1 tablet (20 mg total) by mouth daily.  Indication:  Gastroesophageal Reflux Disease   sucralfate 1 GM/10ML suspension Commonly known as:  CARAFATE Take 10 mLs (1 g total) by mouth 4 (four) times daily -  with meals and at bedtime.  Indication:  Gastroesophageal Reflux Disease   traZODone 50 MG tablet Commonly  known as:  DESYREL Take 1 tablet (50 mg total) by mouth at bedtime and may repeat dose one time if needed.  Indication:  Trouble Sleeping      Follow-up Information    [facility]. Go on 04/27/2016.   Why:  Crisis Management Meeting is Friday, April 27, 2016 at 10:00am  Then, Friday, May 04, 2016 at 10:00am with Landis Martins Either walk in or call to the counseling center on Monday and talk to [place] about what you should do until your Friday meeting Contact information: [address], [place] [address] Telephone: [phone] Fax: [phone]          Follow-up recommendations:  Activity:  as tolerated Diet:  heart healthy  Comments:  Take all medications as  prescribed. Keep all follow-up appointments as scheduled.  Do not consume alcohol or use illegal drugs while on prescription medications. Report any adverse effects from your medications to your primary care provider promptly.  In the event of recurrent symptoms or worsening symptoms, call 911, a crisis hotline, or go to the nearest emergency department for evaluation.   Signed: Lindwood Qua, NP 04/23/2016, 12:11 PM

## 2016-04-21 NOTE — Progress Notes (Signed)
Patient ID: Jordan Haley, female   DOB: Dec 18, 1990, 26 y.o.   MRN: [id number]    D: Pt has been appropriate on the unit day, she thought that she was going to be discharged today but was told that she was not. Pt was disappointed due to not being discharged, but reported that she would just upset that she could not go to church. Pt also started refusing her medications and reported that she was glad that this writer was not forcing medication on her. This writer attempted to talk to patient regarding the importance of talking her medication, but she continued to refused. Pt reported that her depression was a 0, her hopelessness was a 0, and her anxiety was a 0. Pt reported that her goal for today was to be free. Pt reported being negative SI/HI, no AH/VH noted. A: 15 min checks continued for patient safety. R: Pt safety maintained.

## 2016-04-21 NOTE — [facility] Group Notes (Signed)
[facility] Group Notes:  (Clinical Social Work)  04/21/2016  11:15-12:00PM  Summary of Progress/Problems:   Today's process group involved patients discussing their feelings related to being hospitalized, as well as benefits they see to being in the hospital. We also talked about activities that various patients like to engage in when spring comes around, using that as a springboard to discussing how to stay well and in the community so that they can enjoy doing all their favorite things rather than coming back to the hospital The patient expressed a primary feeling about being hospitalized is that she is ready to go home, is "fired up" about leaving, and feels like a butterfly that has grown in the cocoon and is ready to fly.  She interacted very appropriately throughout group.  Type of Therapy:  Group Therapy - Process  Participation Level:  Active  Participation Quality:  Attentive, Sharing and Supportive  Affect:  Appropriate  Cognitive:  Alert and Appropriate  Insight:  Engaged  Engagement in Therapy:  Engaged  Modes of Intervention:  Exploration, Discussion  Ambrose Mantle, LCSW 04/21/2016, 1:28 PM
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
## 2016-04-22 NOTE — Progress Notes (Signed)
Pt in day room disappointed that she did not d/c today.  Pt is not med compliant.  Pt sts the meds make her feel funny and does not want to take them.  Pt is talkative and insightful as to recovery.  Pt interacts in dayroom. Pt takes a snack and after dayroom closes returns to room and is sleeping. Pt remains safe on unit.

## 2016-04-22 NOTE — [facility] Group Notes (Signed)
[facility] Group Notes:  (Clinical Social Work)  04/22/2016  11:00AM-12:00PM  Summary of Progress/Problems:  The main focus of today's process group was to listen to a variety of genres of music and to identify that different types of music provoke different responses.  The patient then was able to identify personally what was soothing for them, as well as energizing, as well as how patient can personally use this knowledge in sleep habits, with depression, and with other symptoms.  The patient expressed at the beginning of group the overall feeling of anger/disappointment/anxiety, and rated her anxiety at a 4.  At the end of group she said she was no longer angry about not discharging today, and her anxiety was at a 2.  Type of Therapy:  Music Therapy   Participation Level:  Active  Participation Quality:  Attentive and Sharing  Affect:  Blunted  Cognitive:  Oriented  Insight:  Engaged  Engagement in Therapy:  Engaged  Modes of Intervention:   Activity, Exploration  Ambrose Mantle, LCSW 04/22/2016

## 2016-04-22 NOTE — Progress Notes (Signed)
Patient ID: Jordan Haley, female   DOB: 04/19/1990, 26 y.o.   MRN: [id number]   D: Pt has been appropriate on the unit today, she has attended all groups and engaged in treatment. Pt was upset again today due to not being able to go home. Pt has also refused all medications and has thanked staff for not forcing medication down her throat. Pt reported that her depression was a 0, her hopelessness was a 0, and her anxiety was a 0. Pt reported that her goal for today was to just be herself. Pt reported being negative SI/HI, no AH/VH noted. A: 15 min checks continued for patient safety. R: Pt safety maintained.

## 2016-04-22 NOTE — Progress Notes (Signed)
Pt in room at shift change, reading a book.  Pt has good eye contact and responds to questions.  Pt sts she is ready to discharge. Pt refuses night meds and sts she does not need them. Pt educated to the importance of taking her medications.  Pt continues to refuse medications. Pt denies SI, HI and AVH.  Pt verbally contracts for safety. Pt attends group and interacts with staff and other patients.  Pt remains safe on unit.

## 2016-04-23 ENCOUNTER — Encounter (HOSPITAL_COMMUNITY): Payer: Self-pay | Admitting: Psychiatry

## 2016-04-23 DIAGNOSIS — F4323 Adjustment disorder with mixed anxiety and depressed mood: Secondary | ICD-10-CM | POA: Clinically undetermined

## 2016-04-23 DIAGNOSIS — F19950 Other psychoactive substance use, unspecified with psychoactive substance-induced psychotic disorder with delusions: Secondary | ICD-10-CM

## 2016-04-23 DIAGNOSIS — F1220 Cannabis dependence, uncomplicated: Secondary | ICD-10-CM

## 2016-04-23 DIAGNOSIS — F3181 Bipolar II disorder: Secondary | ICD-10-CM | POA: Clinically undetermined

## 2016-04-23 MED ORDER — ARIPIPRAZOLE 20 MG PO TABS: 20.0000 mg | ORAL_TABLET | Freq: Every day | ORAL | 0 refills | Status: DC

## 2016-04-23 NOTE — Progress Notes (Signed)
D:  Patient's self inventory sheet, patient sleeps good, takes sleep medication.  Good appetite, low to normal energy level, good concentration.  Rated depression 3, hopeless 4, denied anxiety.  Withdrawals, yes/no, tremors, sedation, chilling, cravings, cramping.  Denied SI.  Physical problems, lightheaded, pain, wristt, worst pain #8 in past 24 hours.  Pain medication helpful.  Goal sleep and eat.  Plans to sleep.  Does have discharge plans. A:  Medications administered per MD orders.  Emotional support and encouragement given patient. R:  Denied SI and HI, contracts for safety.  Denied A/V hallucinations.  Safety maintained with 15 minute checks.

## 2016-04-23 NOTE — Plan of Care (Signed)
Problem: [facility] Participation in Recreation Therapeutic Interventions Goal: STG-Patient will identify at least five coping skills for ** STG: Coping Skills - Patient will be able to identify at least 5 coping skills for depression by conclusion of recreation therapy tx  Outcome: Completed/Met Date Met: 04/23/16 Pt was able to identify coping skills at completion of coping skills recreation therapy session.  Victorino Sparrow, LRT/CTRS

## 2016-04-23 NOTE — Progress Notes (Signed)
Recreation Therapy Notes  Date: 04/23/16 Time: 1000 Location: 500 Hall Dayroom  Group Topic: Coping Skills  Goal Area(s) Addresses:  Patients will be able to identify positive coping skills. Patients will be able to identify the benefits of coping skills. Patients will be able to identify the benefits of using coping skills post d/c.  Behavioral Response: Engaged  Intervention: Magazines, coping skills worksheet, scissors, glue sticks, Holiday representative paper  Activity: Coping Skills.  Patients were to identify coping skills in the magazines that could be used for diversions, social, cognitive, tension releasers and physical.  Patients were to cut out pictures of the coping skills they identified and glue them under section where they would be most useful to them.  Education: Pharmacologist, Building control surveyor.   Education Outcome: Acknowledges understanding/In group clarification offered/Needs additional education.   Clinical Observations/Feedback: Pt identified her coping skills as brain checks, completing goals with family/friends, making plans to succeed, being one with my spirit, yoga and art, design.  Pt stated using her coping skills would "help me to feel satisfied, complete and not put stuff on other people".   Caroll Rancher, LRT/CTRS         Caroll Rancher A 04/23/2016 12:20 PM
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
## 2016-04-23 NOTE — Progress Notes (Signed)
Discharge Note:  Patient discharged home.  Patient denied SI and HI.  Denied A/V hallucinations.  Patient stated she received all her belongings.  Suicide prevention information given and discussed with patient who stated she understood and had no questions.  Patient stated she appreciated all assistance received from [facility] staff.  All required discharge information given to patient at discharge.

## 2016-09-07 ENCOUNTER — Encounter (HOSPITAL_COMMUNITY): Payer: Self-pay | Admitting: Emergency Medicine

## 2016-09-07 ENCOUNTER — Ambulatory Visit (HOSPITAL_COMMUNITY)
Admission: EM | Admit: 2016-09-07 | Discharge: 2016-09-07 | Disposition: A | Discharge status: Home / Self Care | Payer: BLUE CROSS/BLUE SHIELD | Attending: Family Medicine | Admitting: Family Medicine

## 2016-09-07 DIAGNOSIS — R109 Unspecified abdominal pain: Secondary | ICD-10-CM

## 2016-09-07 DIAGNOSIS — Z3202 Encounter for pregnancy test, result negative: Secondary | ICD-10-CM

## 2016-09-07 DIAGNOSIS — K5900 Constipation, unspecified: Secondary | ICD-10-CM

## 2016-09-07 LAB — POCT URINALYSIS DIP (DEVICE)
Bilirubin Urine: NEGATIVE
GLUCOSE, UA: NEGATIVE mg/dL
Hgb urine dipstick: NEGATIVE
KETONES UR: NEGATIVE mg/dL
Leukocytes, UA: NEGATIVE
Nitrite: NEGATIVE
PROTEIN: NEGATIVE mg/dL
Specific Gravity, Urine: 1.025 (ref 1.005–1.030)
UROBILINOGEN UA: 0.2 mg/dL (ref 0.0–1.0)
pH: 6.5 (ref 5.0–8.0)

## 2016-09-07 LAB — POCT PREGNANCY, URINE: PREG TEST UR: NEGATIVE

## 2016-09-07 MED ORDER — DOCUSATE SODIUM 50 MG PO CAPS
50.0000 mg | ORAL_CAPSULE | Freq: Two times a day (BID) | ORAL | 0 refills | Status: DC
Start: 2016-09-07 — End: 2017-02-07

## 2016-09-07 MED ORDER — POLYETHYLENE GLYCOL 3350 17 G PO PACK
17.0000 g | PACK | Freq: Every day | ORAL | 0 refills | Status: DC
Start: 2016-09-07 — End: 2017-02-07

## 2016-09-07 NOTE — ED Provider Notes (Signed)
[facility]    CSN: [id number] Arrival date & time: 09/07/16  1833     History   Chief Complaint Chief Complaint  Patient presents with  . Abdominal Pain    HPI Jordan Haley is a 26 y.o. female.   26 year old female comes in for a few day history of "fluttering in her stomach". She states she would like a pregnancy test to rule out pregnancy. She has not been sexually active since April, just finished her cycle, but states it would still like to have the pregnancy test. Denies vaginal discharge, itching/pain, spotting. Denies urinary frequency, hematuria. Does endorse dysuria. She has a history of constipation, and often requires straining for bowel movements. Denies abdominal pain, nausea, vomiting. Denies fever, chills, night sweats.      Past Medical History:  Diagnosis Date  . Asthma     Patient Active Problem List   Diagnosis Date Noted  . Substance-induced psychotic disorder with delusions (HCC) 04/23/2016  . Cannabis use disorder, moderate, dependence (HCC) 04/23/2016  . Adjustment disorder with mixed anxiety and depressed mood 04/23/2016    Past Surgical History:  Procedure Laterality Date  . NO PAST SURGERIES      OB History    No data available       Home Medications    Prior to Admission medications   Medication Sig Start Date End Date Taking? Authorizing Provider  ARIPiprazole (ABILIFY) 20 MG tablet Take 1 tablet (20 mg total) by mouth daily. 04/24/16   Adonis Brook, NP  docusate sodium (COLACE) 50 MG capsule Take 1 capsule (50 mg total) by mouth 2 (two) times daily. 09/07/16   Cathie Hoops, Sonita Michiels V, PA-C  hydrOXYzine (ATARAX/VISTARIL) 25 MG tablet Take 1 tablet (25 mg total) by mouth 3 (three) times daily as needed for anxiety. 04/21/16   Oneta Rack, NP  pantoprazole (PROTONIX) 20 MG tablet Take 1 tablet (20 mg total) by mouth daily. Patient not taking: Reported on 02/17/2016 08/16/14   Antony Madura, PA-C  polyethylene glycol [facility]) packet  Take 17 g by mouth daily. 09/07/16   Cathie Hoops, Leeah Politano V, PA-C  sucralfate (CARAFATE) 1 GM/10ML suspension Take 10 mLs (1 g total) by mouth 4 (four) times daily -  with meals and at bedtime. Patient not taking: Reported on 02/17/2016 08/16/14   Antony Madura, PA-C  traZODone (DESYREL) 50 MG tablet Take 1 tablet (50 mg total) by mouth at bedtime and may repeat dose one time if needed. 04/21/16   Oneta Rack, NP    Family History Family History  Problem Relation Age of Onset  . Mental illness Neg Hx     Social History Social History  Substance Use Topics  . Smoking status: Former Games developer  . Smokeless tobacco: Never Used  . Alcohol use No     Comment: occasional; social     Allergies   Patient has no known allergies.   Review of Systems Review of Systems  Reason unable to perform ROS: See HPI as above.     Physical Exam Triage Vital Signs ED Triage Vitals [09/07/16 1904]  Enc Vitals Group     BP 102/65     Pulse Rate 78     Resp 16     Temp 98.6 F (37 C)     Temp Source Oral     SpO2 100 %     Weight 128 lb (58.1 kg)     Height      Head Circumference  Peak Flow      Pain Score      Pain Loc      Pain Edu?      Excl. in GC?    No data found.   Updated Vital Signs BP 102/65   Pulse 78   Temp 98.6 F (37 C) (Oral)   Resp 16   Wt 128 lb (58.1 kg)   LMP 08/29/2016   SpO2 100%   BMI 23.41 kg/m    Physical Exam  Constitutional: She is oriented to person, place, and time. She appears well-developed and well-nourished. No distress.  HENT:  Head: Normocephalic and atraumatic.  Eyes: Pupils are equal, round, and reactive to light. Conjunctivae are normal.  Cardiovascular: Normal rate, regular rhythm and normal heart sounds.  Exam reveals no gallop and no friction rub.   No murmur heard. Pulmonary/Chest: Effort normal and breath sounds normal. She has no wheezes. She has no rales.  Abdominal: Soft. Bowel sounds are normal. She exhibits mass (along RUQ to RLQ).  There is no tenderness. There is no rebound and no guarding.  Neurological: She is alert and oriented to person, place, and time.  Skin: Skin is warm and dry.  Psychiatric: She has a normal mood and affect. Her behavior is normal. Judgment normal.     UC Treatments / Results  Labs (all labs ordered are listed, but only abnormal results are displayed) Labs Reviewed  POCT PREGNANCY, URINE  POCT URINALYSIS DIP (DEVICE)    EKG  EKG Interpretation None       Radiology No results found.  Procedures Procedures (including critical care time)  Medications Ordered in UC Medications - No data to display   Initial Impression / Assessment and Plan / UC Course  I have reviewed the triage vital signs and the nursing notes.  Pertinent labs & imaging results that were available during my care of the patient were reviewed by me and considered in my medical decision making (see chart for details).    Urine negative for UTI or pregnancy. Discussed with patient symptoms can be caused by constipation, take Colace and MiraLAX as directed. Push fluids. Follow up with PCP for further evaluation or treatment needed. Return precautions given.  Final Clinical Impressions(s) / UC Diagnoses   Final diagnoses:  Constipation, unspecified constipation type    New Prescriptions Discharge Medication List as of 09/07/2016  8:12 PM    START taking these medications   Details  docusate sodium (COLACE) 50 MG capsule Take 1 capsule (50 mg total) by mouth 2 (two) times daily., Starting Fri 09/07/2016, Normal    polyethylene glycol (MIRALAX) packet Take 17 g by mouth daily., Starting Fri 09/07/2016, Normal          Cathie Hoops, Jennavie Martinek V, PA-C 09/07/16 2019

## 2016-09-07 NOTE — ED Triage Notes (Signed)
PT reports a "fluttering" sensation in stomach. PT would like a pregnancy test. PT reports she just got off of her menstrual period. PT would like provider to palpate her stomach.

## 2016-09-07 NOTE — Discharge Instructions (Signed)
Your urine was negative for infection or pregnancy. Take Colace and MiraLAX for constipation. Keep hydrated, your urine should be clear to pale yellow in color. Monitor for any worsening of symptoms, nausea, vomiting, fever, follow-up for reevaluation.

## 2017-01-09 ENCOUNTER — Encounter: Payer: Self-pay | Admitting: Physician Assistant

## 2017-01-29 ENCOUNTER — Ambulatory Visit: Payer: Self-pay | Admitting: Physician Assistant

## 2017-02-07 ENCOUNTER — Encounter: Payer: Self-pay | Admitting: Physician Assistant

## 2017-02-07 ENCOUNTER — Telehealth: Payer: Self-pay | Admitting: *Deleted

## 2017-02-07 ENCOUNTER — Ambulatory Visit (INDEPENDENT_AMBULATORY_CARE_PROVIDER_SITE_OTHER): Payer: Self-pay | Admitting: Physician Assistant

## 2017-02-07 ENCOUNTER — Other Ambulatory Visit (INDEPENDENT_AMBULATORY_CARE_PROVIDER_SITE_OTHER): Payer: Self-pay

## 2017-02-07 VITALS — BP 110/58 | HR 72 | Ht 62.0 in | Wt 117.4 lb

## 2017-02-07 DIAGNOSIS — R112 Nausea with vomiting, unspecified: Secondary | ICD-10-CM

## 2017-02-07 DIAGNOSIS — R1031 Right lower quadrant pain: Secondary | ICD-10-CM

## 2017-02-07 DIAGNOSIS — R109 Unspecified abdominal pain: Secondary | ICD-10-CM

## 2017-02-07 LAB — CBC WITH DIFFERENTIAL/PLATELET
BASOS ABS: 0 10*3/uL (ref 0.0–0.1)
Basophils Relative: 1.5 % (ref 0.0–3.0)
Eosinophils Absolute: 0.1 10*3/uL (ref 0.0–0.7)
Eosinophils Relative: 2.5 % (ref 0.0–5.0)
HCT: 41.5 % (ref 36.0–46.0)
HEMOGLOBIN: 13.5 g/dL (ref 12.0–15.0)
Lymphs Abs: 1.8 10*3/uL (ref 0.7–4.0)
MCHC: 32.4 g/dL (ref 30.0–36.0)
MCV: 90.3 fl (ref 78.0–100.0)
MONOS PCT: 7.6 % (ref 3.0–12.0)
Monocytes Absolute: 0.3 10*3/uL (ref 0.1–1.0)
NEUTROS ABS: 1.1 10*3/uL — AB (ref 1.4–7.7)
Neutrophils Relative %: 32.7 % — ABNORMAL LOW (ref 43.0–77.0)
PLATELETS: 219 10*3/uL (ref 150.0–400.0)
RBC: 4.6 Mil/uL (ref 3.87–5.11)
RDW: 12.6 % (ref 11.5–15.5)
WBC: 3.3 10*3/uL — ABNORMAL LOW (ref 4.0–10.5)

## 2017-02-07 LAB — COMPREHENSIVE METABOLIC PANEL
ALT: 10 U/L (ref 0–35)
AST: 18 U/L (ref 0–37)
Albumin: 4.4 g/dL (ref 3.5–5.2)
Alkaline Phosphatase: 54 U/L (ref 39–117)
BILIRUBIN TOTAL: 0.3 mg/dL (ref 0.2–1.2)
BUN: 7 mg/dL (ref 6–23)
CALCIUM: 8.8 mg/dL (ref 8.4–10.5)
CO2: 28 meq/L (ref 19–32)
Chloride: 105 mEq/L (ref 96–112)
Creatinine, Ser: 0.74 mg/dL (ref 0.40–1.20)
GFR: 123.94 mL/min (ref >60.00)
Glucose, Bld: 84 mg/dL (ref 70–99)
Potassium: 4 mEq/L (ref 3.5–5.1)
Sodium: 137 mEq/L (ref 135–145)
Total Protein: 6.8 g/dL (ref 6.0–8.3)

## 2017-02-07 LAB — SEDIMENTATION RATE: Sed Rate: 1 mm/hr (ref 0–20)

## 2017-02-07 LAB — HCG, QUANTITATIVE, PREGNANCY: Quantitative HCG: 0.66 m[IU]/mL

## 2017-02-07 NOTE — Patient Instructions (Addendum)
Please go to the basement level to have your labs drawn.  You can get IB Donald Prose at [facility] or any pharmacy. Take 2 capsules by mouth twice daily.  Please get an appointment with a mental health counselor.   We will call you this afternoon with the CT scan date and time and instructions.    You have been scheduled for a CT scan of the abdomen and pelvis at [facility] ([address]---this is in the same building as Press photographer).   You are scheduled on  at . You should arrive 15 minutes prior to your appointment time for registration. Please follow the written instructions below on the day of your exam:  WARNING: IF YOU ARE ALLERGIC TO IODINE/X-RAY DYE, PLEASE NOTIFY RADIOLOGY IMMEDIATELY AT [phone]! YOU WILL BE GIVEN A 13 HOUR PREMEDICATION PREP.  1) Do not eat  anything after  (4 hours prior to your test) 2) You have been given 2 bottles of oral contrast to drink. The solution may taste               better if refrigerated, but do NOT add ice or any other liquid to this solution. Shake             well before drinking.    Drink 1 bottle of contrast @        (2 hours prior to your exam)  Drink 1 bottle of contrast @        (1 hour prior to your exam)  You may take any medications as prescribed with a small amount of water except for the following: Metformin, Glucophage, Glucovance, Avandamet, Riomet, Fortamet, Actoplus Met, Janumet, Glumetza or Metaglip. The above medications must be held the day of the exam AND 48 hours after the exam.  The purpose of you drinking the oral contrast is to aid in the visualization of your intestinal tract. The contrast solution may cause some diarrhea. Before your exam is started, you will be given a small amount of fluid to drink. Depending on your individual set of symptoms, you may also receive an intravenous injection of x-ray contrast/dye. Plan on being at [facility] for 30 minutes or long, depending on the type of exam you  are having performed.  If you have any questions regarding your exam or if you need to reschedule, you may call the CT department at [phone] between the hours of 8:00 am and 5:00 pm, Monday-Friday.  ________________________________________________________________________
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
## 2017-02-07 NOTE — Telephone Encounter (Signed)
LM for the patient to advise we changed the date of the CT scan to Friday 02-15-2017.  She has to arrive at 3:15 PM. She cannot eat after 11:30 . She needs to drink the 1st bottle of contrast at 1:30 PM and the 2nd bottle of contrast at 2:30 PM.

## 2017-02-08 ENCOUNTER — Other Ambulatory Visit: Payer: Self-pay | Admitting: *Deleted

## 2017-02-08 ENCOUNTER — Other Ambulatory Visit: Payer: Self-pay

## 2017-02-08 ENCOUNTER — Telehealth: Payer: Self-pay | Admitting: *Deleted

## 2017-02-08 DIAGNOSIS — R112 Nausea with vomiting, unspecified: Secondary | ICD-10-CM

## 2017-02-08 NOTE — Telephone Encounter (Signed)
Called the patient to advise her results were in and per Hyacinth MeekerJennifer Lemmon PA and Dr. Christella HartiganJacobs the results looked inconclusive.  The lab tech said it looked negative.  Dr.Jacobs suggested a pregnancy urine test.  The patient is willing to come in today for that test.

## 2017-02-08 NOTE — Telephone Encounter (Signed)
The patient called me to ask about her lab results.  Per Arlyss QueenBeth McKew LPN her labs show nothing concerning. Her HGB is 13.5.  We will call her when we get the Urine pregnancy test results.  Also Mike Gipmy Esterwood PA will review the labs when she comes in Monday 02-11-17. We will call her if we need her to know anything else about her labs.

## 2017-02-09 LAB — URINE CULTURE
MICRO NUMBER:: 90078437
SPECIMEN QUALITY: ADEQUATE

## 2017-02-09 LAB — PREGNANCY, URINE

## 2017-02-11 ENCOUNTER — Other Ambulatory Visit: Payer: Medicaid Other

## 2017-02-11 DIAGNOSIS — R112 Nausea with vomiting, unspecified: Secondary | ICD-10-CM

## 2017-02-12 ENCOUNTER — Other Ambulatory Visit: Payer: Self-pay

## 2017-02-12 DIAGNOSIS — R112 Nausea with vomiting, unspecified: Secondary | ICD-10-CM

## 2017-02-13 ENCOUNTER — Telehealth: Payer: Self-pay | Admitting: Physician Assistant

## 2017-02-13 LAB — PREGNANCY, URINE: PREG TEST UR: NEGATIVE

## 2017-02-13 NOTE — Telephone Encounter (Signed)
Left her a message of the negative results.

## 2017-02-14 ENCOUNTER — Other Ambulatory Visit: Payer: Self-pay

## 2017-02-15 ENCOUNTER — Ambulatory Visit (INDEPENDENT_AMBULATORY_CARE_PROVIDER_SITE_OTHER)
Admission: RE | Admit: 2017-02-15 | Discharge: 2017-02-15 | Disposition: A | Discharge status: Home / Self Care | Payer: Self-pay | Source: Ambulatory Visit | Attending: Physician Assistant | Admitting: Physician Assistant

## 2017-02-15 DIAGNOSIS — R1031 Right lower quadrant pain: Secondary | ICD-10-CM

## 2017-02-15 DIAGNOSIS — R112 Nausea with vomiting, unspecified: Secondary | ICD-10-CM

## 2017-02-15 DIAGNOSIS — R109 Unspecified abdominal pain: Secondary | ICD-10-CM

## 2017-02-15 MED ORDER — IOPAMIDOL (ISOVUE-300) INJECTION 61%
80.0000 mL | Freq: Once | INTRAVENOUS | Status: AC | PRN
  Administered 2017-02-15: 80 mL via INTRAVENOUS

## 2018-01-23 IMAGING — US US PELVIS COMPLETE
1 series · 14 of 25 positions shown · non-contrast
Comparison: CT scan 12/16/2011

CLINICAL DATA: Left lower quadrant pain, possible ovarian cyst



[Series 1: us pelvis complete · 0.26mm/px · 14 of 65 slices shown]
[im 1/65]
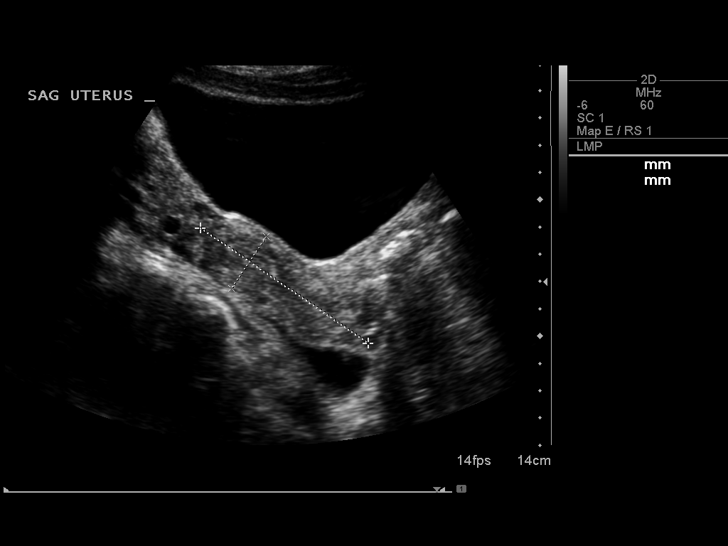
[im 6/65]
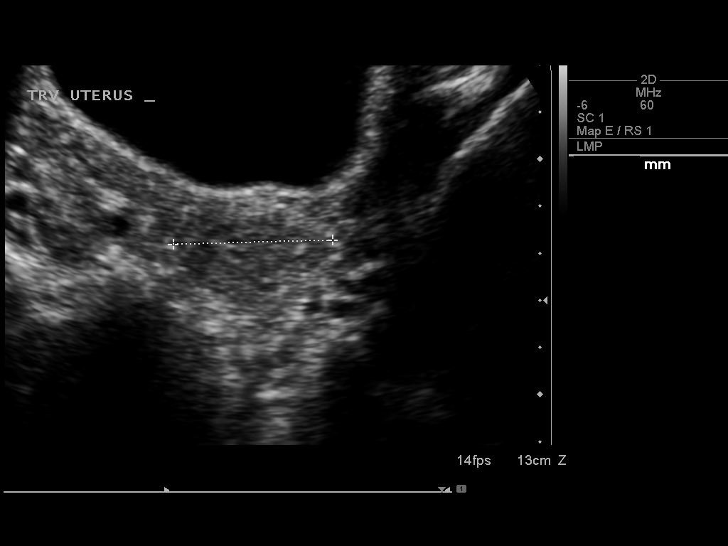
[im 11/65]
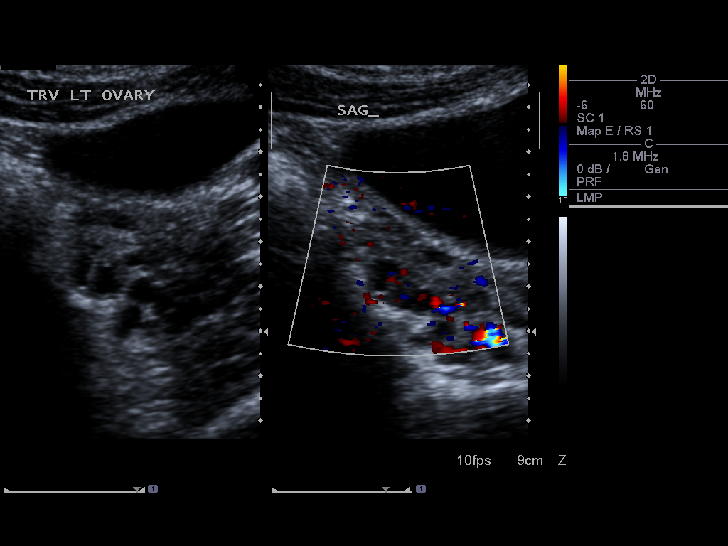
[im 17/65]
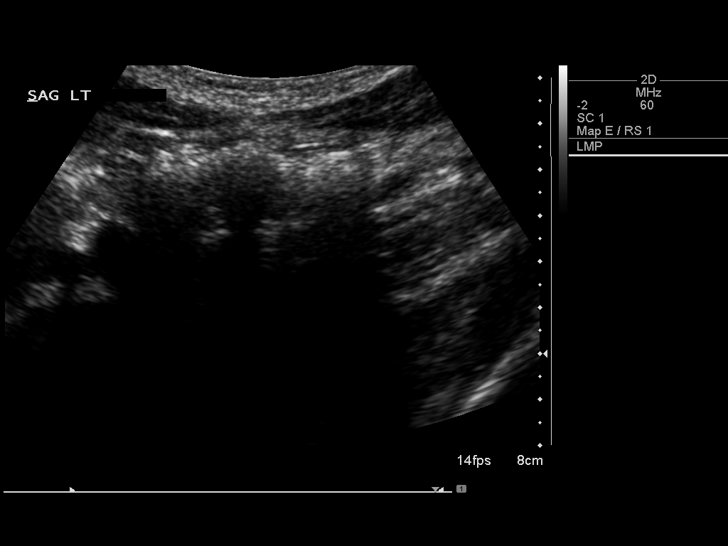
[im 22/65]
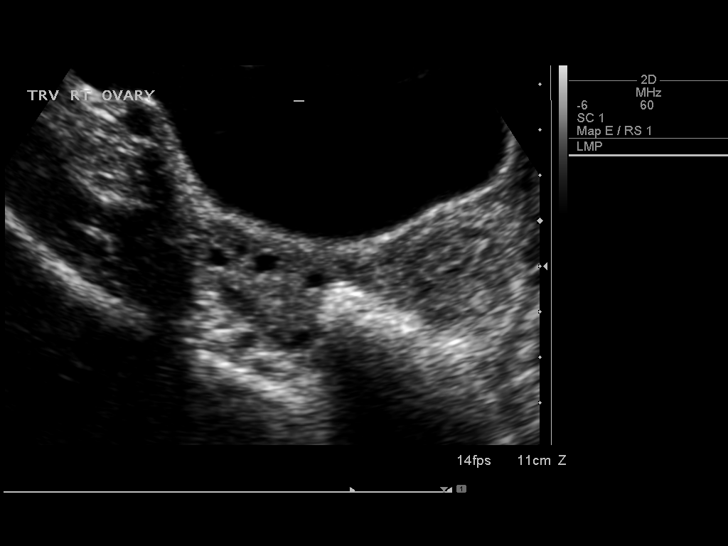
[im 25/65]
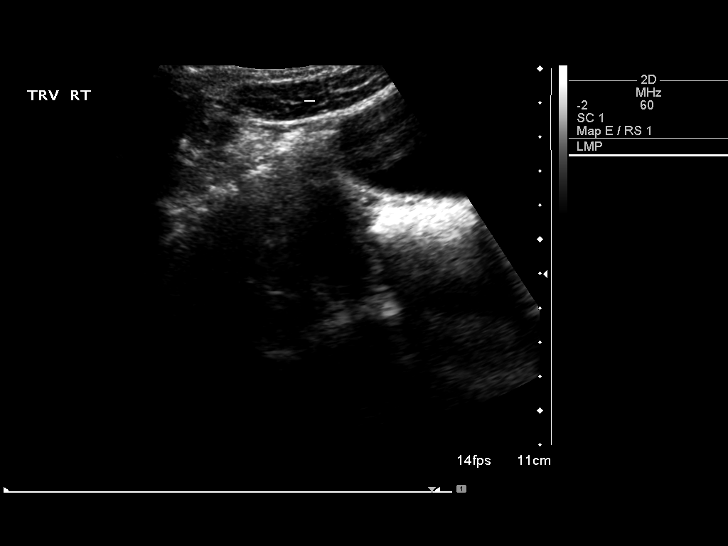
[im 30/65]
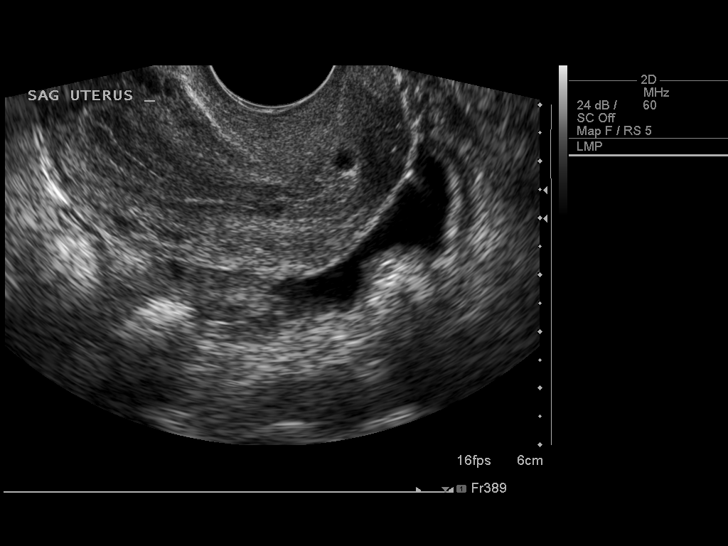
[im 35/65]
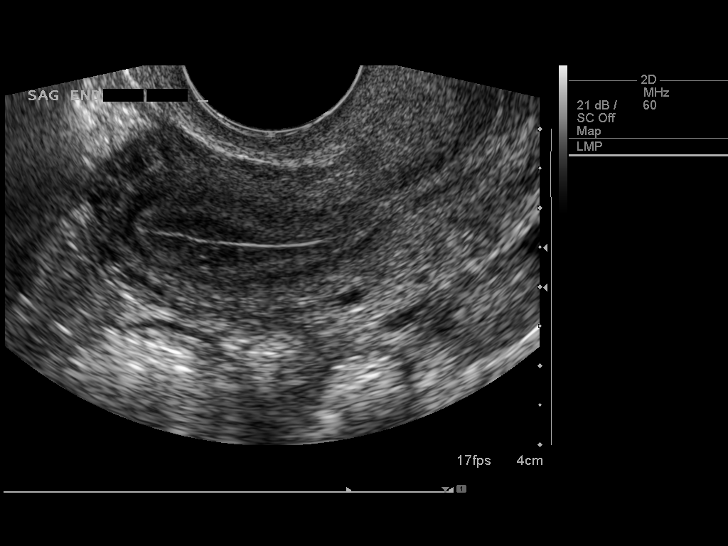
[im 41/65]
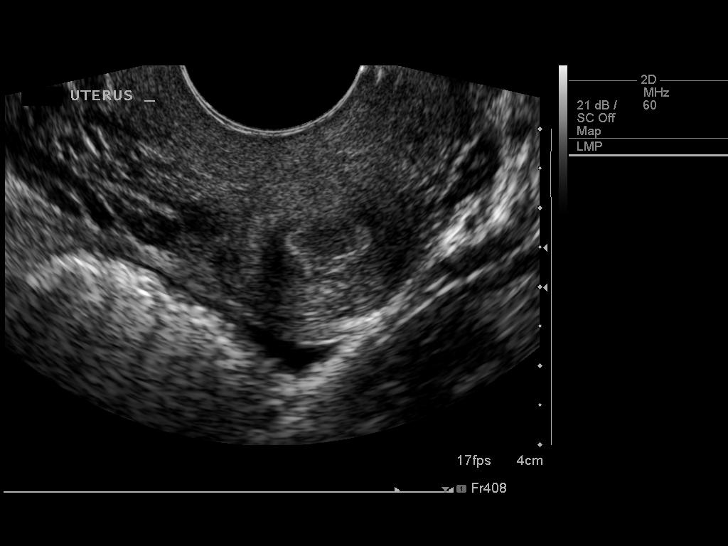
[im 43/65]
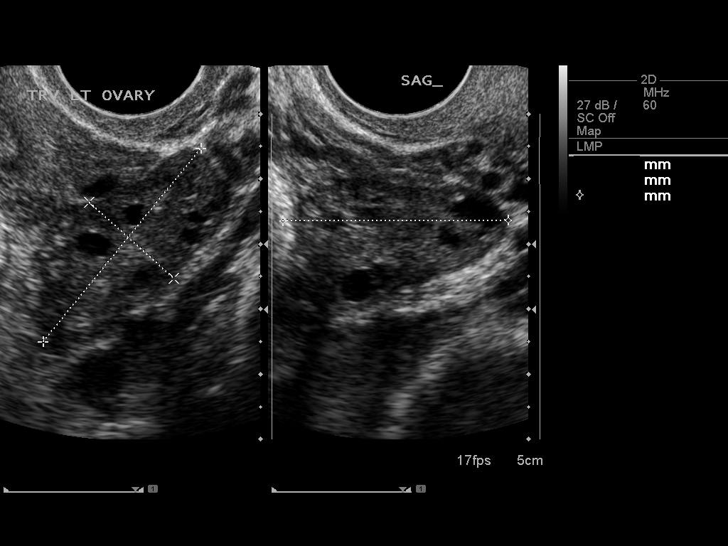
[im 49/65]
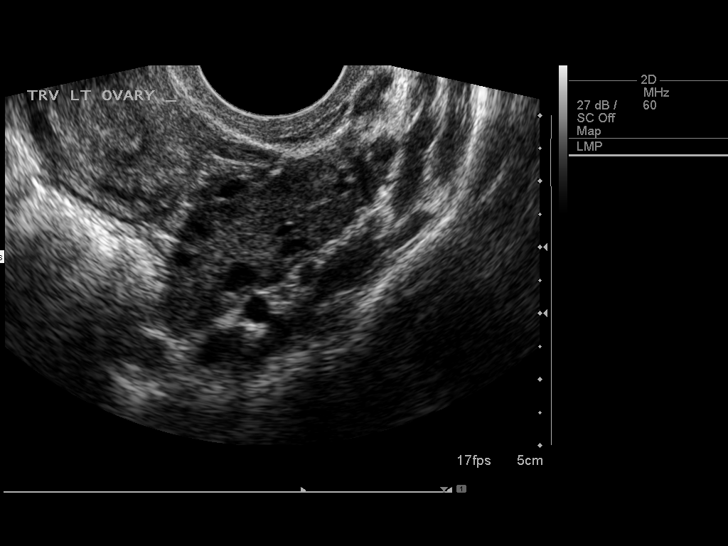
[im 54/65]
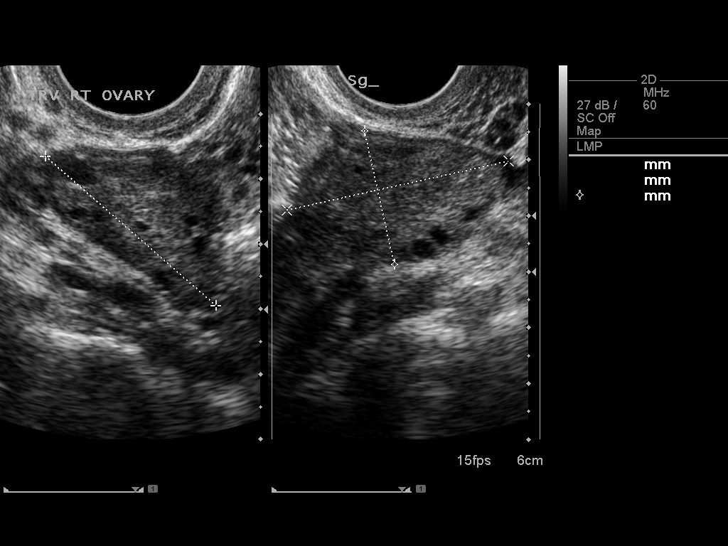
[im 59/65]
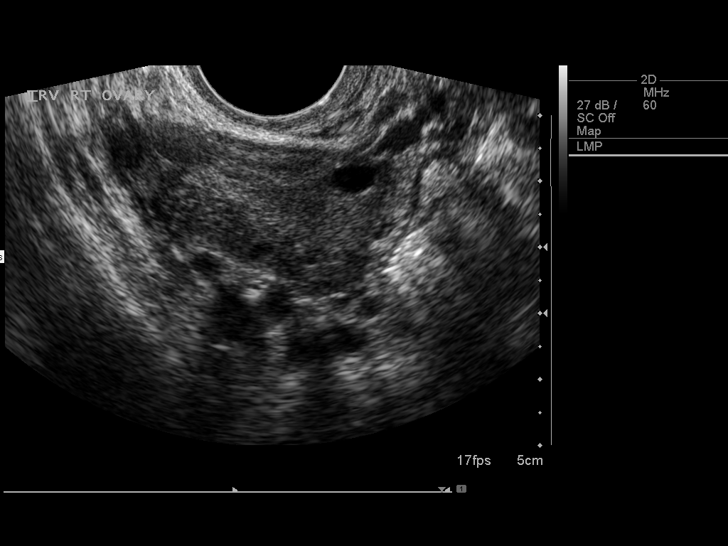
[im 65/65]
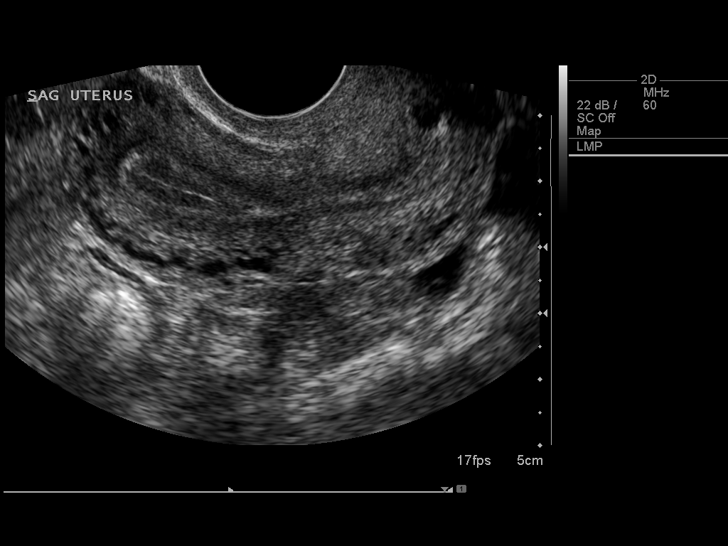

[14 of 25 positions shown; findings below may reference images not displayed]

FINDINGS: Uterus

Measurements: 7.5 x 2.3 x 3.4 cm. No fibroids or other mass
visualized.

Endometrium

Thickness: 11 mm within normal limits. No focal abnormality
visualized.

Right ovary

Measurements: 4.1 x 2.5 x 3.5 cm. Normal appearance/no adnexal mass.
Normal appearing follicles are noted.

Left ovary

Measurements: 3.5 x 1.8 x 3.8 cm. Normal appearance/no adnexal mass.
Normal appearing ovarian follicle

Other findings

Trace pelvic free fluid is noted.
IMPRESSION: 1. Normal size uterus. No fibroids are noted. Normal endometrial
stripe thickness.
2. Unremarkable ovaries.  Bilateral follicles are noted.

## 2019-04-30 IMAGING — CT CT ABD-PELV W/ CM
2 of 4 series · 16 of 46 positions shown, 18 images · IV contrast (ISOVUE 300)
Comparison: 12/16/2011

CLINICAL DATA: RIGHT-side abdominal pain, flank pain, nausea,
bloating, severe constipation

EXAM:
CT ABDOMEN AND PELVIS WITH CONTRAST
TECHNIQUE: Multidetector CT imaging of the abdomen and pelvis was performed
using the standard protocol following bolus administration of
intravenous contrast. Sagittal and coronal MPR images reconstructed
from axial data set.
CONTRAST:  80mL [id number]-HLL IOPAMIDOL (S3E69S-HLL) INJECTION 61% IV

[Series 2: abd/pel w · axial · 0.60mm/px · z∈[-380,-24]mm · 13 of 79 slices shown, 15 images]
[im 4/79  soft-tissue]
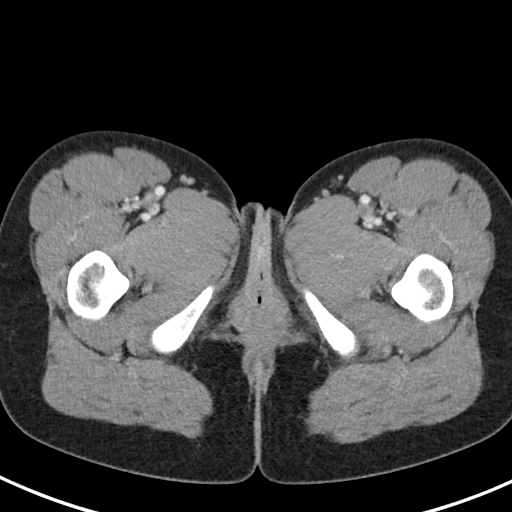
[im 4/79  bone]
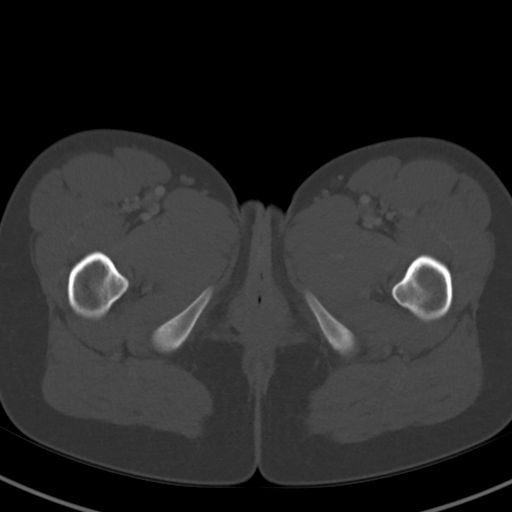
[im 10/79  soft-tissue]
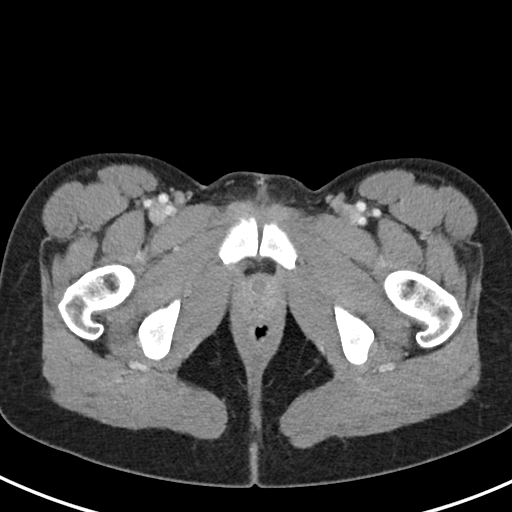
[im 16/79  soft-tissue]
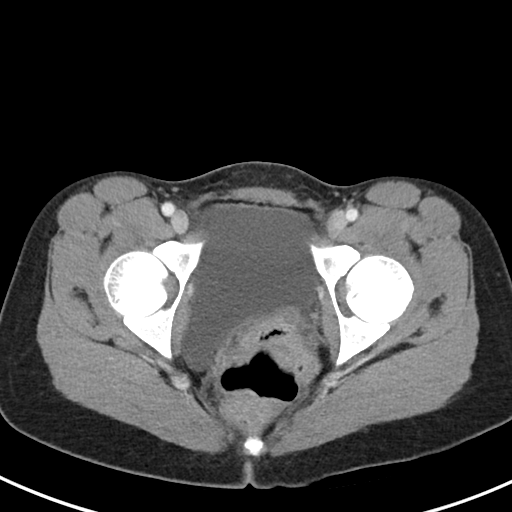
[im 22/79  soft-tissue]
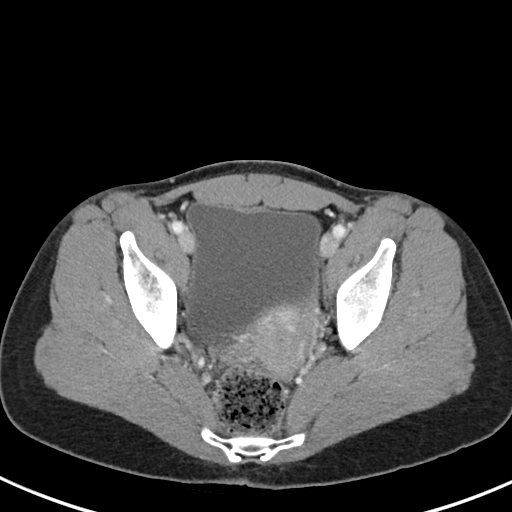
[im 29/79  soft-tissue]
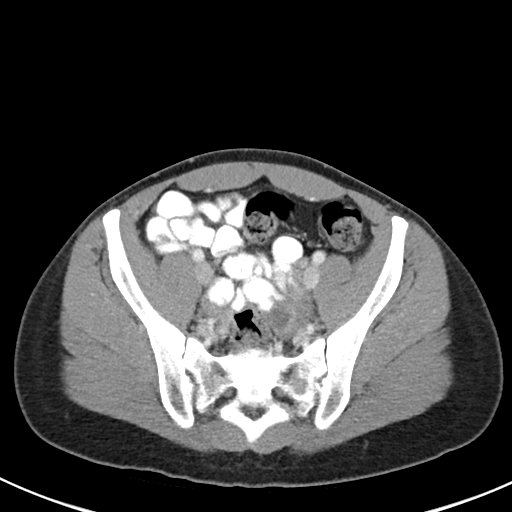
[im 35/79  soft-tissue]
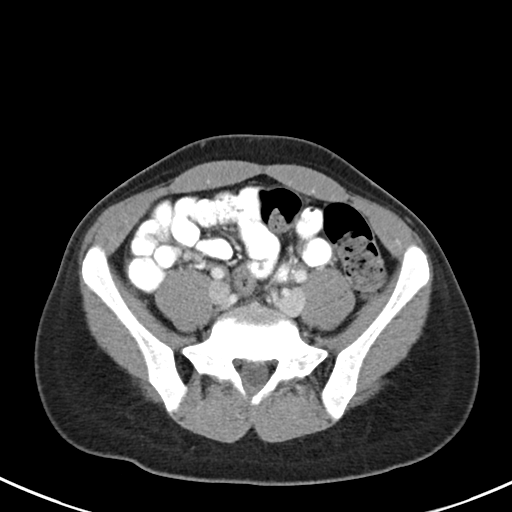
[im 41/79  soft-tissue]
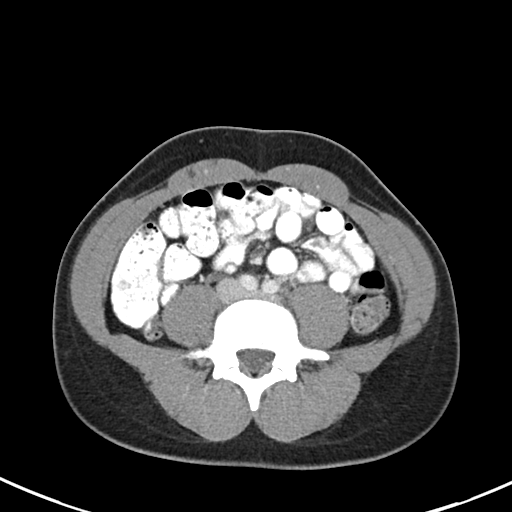
[im 44/79  soft-tissue]
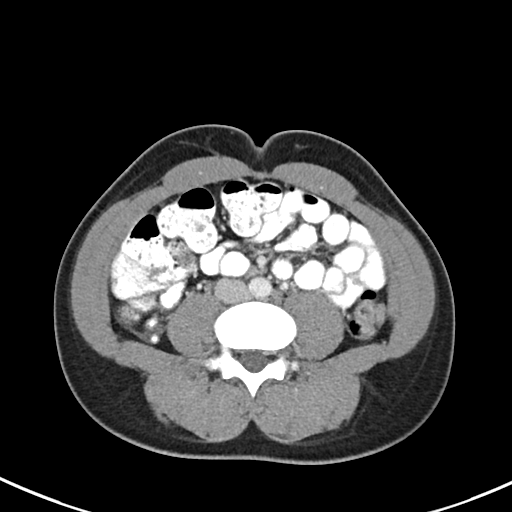
[im 50/79  soft-tissue]
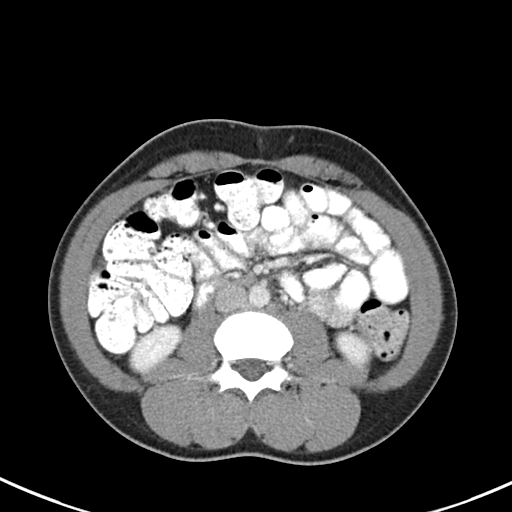
[im 50/79  bone]
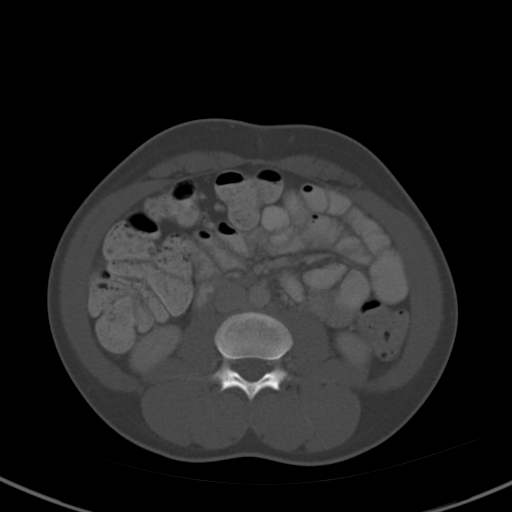
[im 57/79  soft-tissue]
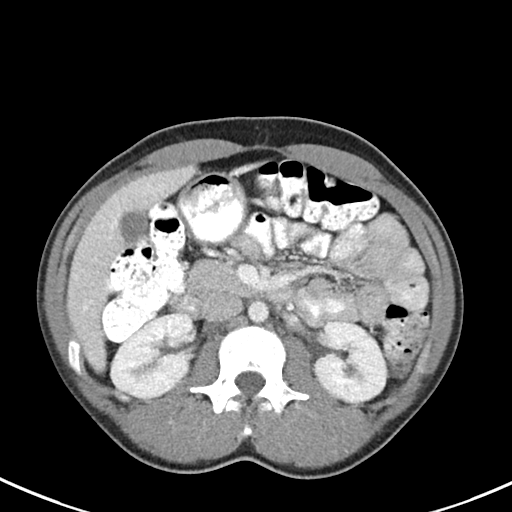
[im 63/79  soft-tissue]
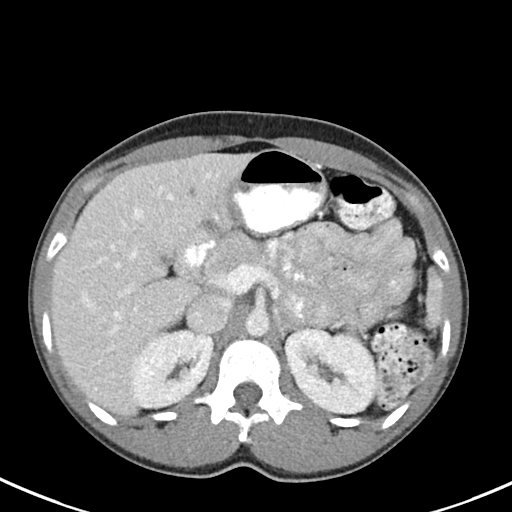
[im 69/79  soft-tissue]
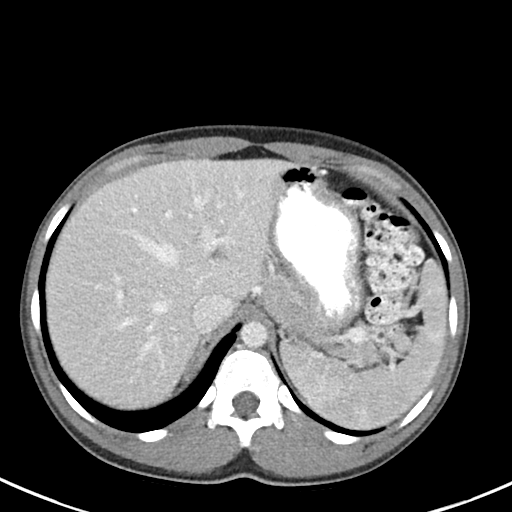
[im 75/79  soft-tissue]
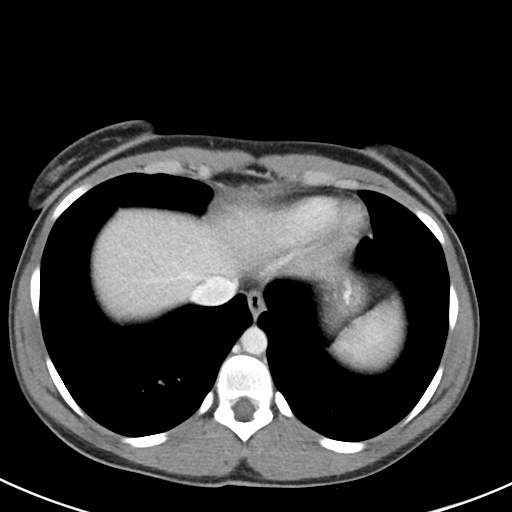

[Series 5: abd/pel w st · coronal · 0.55mm/px · 3 of 67 slices shown]
[im 23/67  soft-tissue]
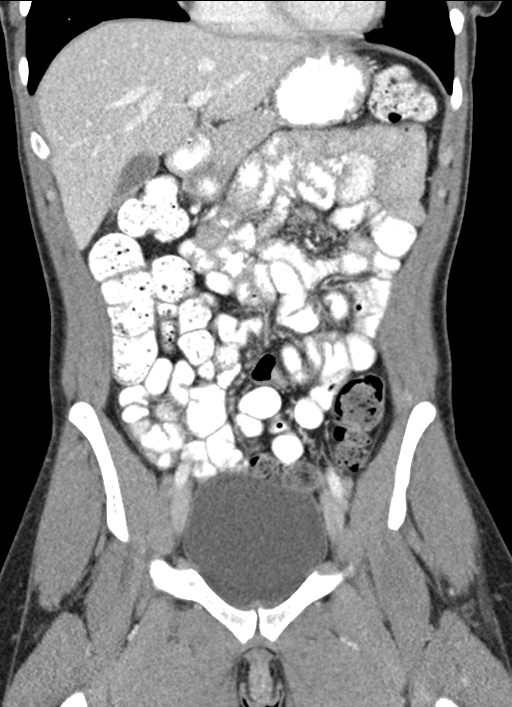
[im 30/67  soft-tissue]
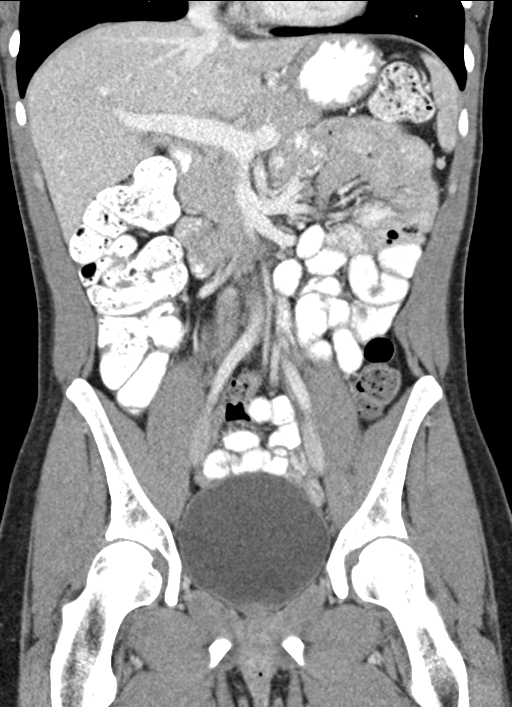
[im 37/67  soft-tissue]
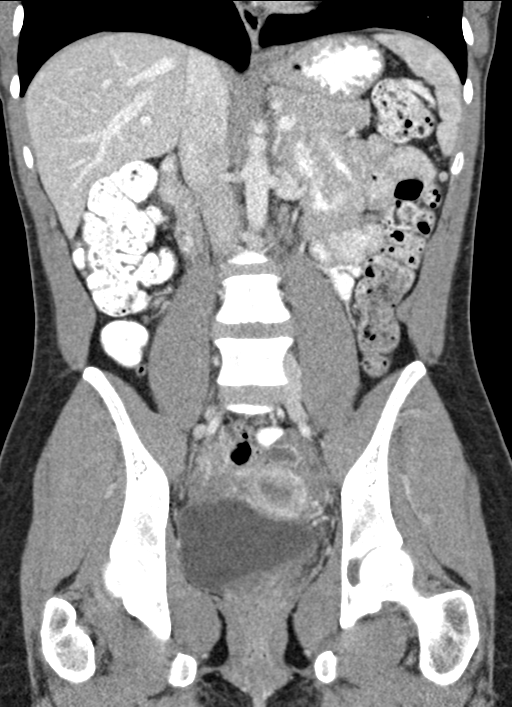

[16 of 46 positions shown; findings below may reference images not displayed]

FINDINGS: Lower chest: Lung bases clear

Hepatobiliary: Gallbladder and liver normal appearance

Pancreas: Normal appearance

Spleen: Normal appearance

Adrenals/Urinary Tract: Adrenal glands, kidneys, ureters, and
bladder normal appearance

Stomach/Bowel: Normal appendix. Stomach and bowel loops normal
appearance.

Vascular/Lymphatic: Vascular structures normal appearance. No
abdominal or pelvic adenopathy.

Reproductive: Uterus and adnexa normal appearance

Other: No free air, free fluid, hernia, or definite inflammatory
process

Musculoskeletal: Normal appearance
IMPRESSION: Normal exam.
